# Patient Record
Sex: Male | Born: 1960
Health system: Southern US, Community
[De-identification: ages and names within clinical notes are randomized; demographics above are authoritative.]

## PROBLEM LIST (undated history)

## (undated) DIAGNOSIS — H409 Unspecified glaucoma: Secondary | ICD-10-CM

## (undated) DIAGNOSIS — E119 Type 2 diabetes mellitus without complications: Secondary | ICD-10-CM

## (undated) HISTORY — PX: REFRACTIVE SURGERY: SHX103

---

## 2009-05-08 ENCOUNTER — Emergency Department (HOSPITAL_BASED_OUTPATIENT_CLINIC_OR_DEPARTMENT_OTHER): Admission: EM | Admit: 2009-05-08 | Discharge: 2009-05-08 | Payer: Self-pay | Admitting: Emergency Medicine

## 2010-01-01 ENCOUNTER — Emergency Department (HOSPITAL_BASED_OUTPATIENT_CLINIC_OR_DEPARTMENT_OTHER): Admission: EM | Admit: 2010-01-01 | Discharge: 2010-01-01 | Payer: Self-pay | Admitting: Emergency Medicine

## 2010-01-01 ENCOUNTER — Ambulatory Visit: Payer: Self-pay | Admitting: Diagnostic Radiology

## 2010-04-07 ENCOUNTER — Emergency Department (HOSPITAL_BASED_OUTPATIENT_CLINIC_OR_DEPARTMENT_OTHER): Admission: EM | Admit: 2010-04-07 | Discharge: 2010-04-07 | Payer: Self-pay | Admitting: Emergency Medicine

## 2010-08-03 ENCOUNTER — Emergency Department (HOSPITAL_BASED_OUTPATIENT_CLINIC_OR_DEPARTMENT_OTHER)
Admission: EM | Admit: 2010-08-03 | Discharge: 2010-08-03 | Disposition: A | Discharge status: Home / Self Care | Payer: Self-pay | Attending: Emergency Medicine | Admitting: Emergency Medicine

## 2010-08-03 DIAGNOSIS — K5900 Constipation, unspecified: Secondary | ICD-10-CM | POA: Insufficient documentation

## 2011-04-14 ENCOUNTER — Encounter: Payer: Self-pay | Admitting: *Deleted

## 2011-04-14 ENCOUNTER — Emergency Department (HOSPITAL_BASED_OUTPATIENT_CLINIC_OR_DEPARTMENT_OTHER)
Admission: EM | Admit: 2011-04-14 | Discharge: 2011-04-15 | Discharge status: Against Medical Advice | Payer: Self-pay | Attending: Emergency Medicine | Admitting: Emergency Medicine

## 2011-04-14 DIAGNOSIS — K089 Disorder of teeth and supporting structures, unspecified: Secondary | ICD-10-CM | POA: Insufficient documentation

## 2011-04-14 DIAGNOSIS — K0889 Other specified disorders of teeth and supporting structures: Secondary | ICD-10-CM

## 2011-04-14 NOTE — ED Notes (Signed)
C/o left lower tooth pain and swelling since Sunday. Percocet 10mg  that he got from friend has not been helping the pain.

## 2011-04-15 NOTE — ED Notes (Signed)
Pt states he is not staying any longer cursing at staff about the wait time as he was walking out of the ed

## 2012-06-21 ENCOUNTER — Encounter (HOSPITAL_BASED_OUTPATIENT_CLINIC_OR_DEPARTMENT_OTHER): Payer: Self-pay

## 2012-06-21 ENCOUNTER — Emergency Department (HOSPITAL_BASED_OUTPATIENT_CLINIC_OR_DEPARTMENT_OTHER)
Admission: EM | Admit: 2012-06-21 | Discharge: 2012-06-21 | Disposition: A | Discharge status: Home / Self Care | Payer: Self-pay | Attending: Emergency Medicine | Admitting: Emergency Medicine

## 2012-06-21 DIAGNOSIS — J020 Streptococcal pharyngitis: Secondary | ICD-10-CM | POA: Insufficient documentation

## 2012-06-21 DIAGNOSIS — R509 Fever, unspecified: Secondary | ICD-10-CM | POA: Insufficient documentation

## 2012-06-21 DIAGNOSIS — Z79899 Other long term (current) drug therapy: Secondary | ICD-10-CM | POA: Insufficient documentation

## 2012-06-21 DIAGNOSIS — Z8669 Personal history of other diseases of the nervous system and sense organs: Secondary | ICD-10-CM | POA: Insufficient documentation

## 2012-06-21 DIAGNOSIS — R51 Headache: Secondary | ICD-10-CM | POA: Insufficient documentation

## 2012-06-21 HISTORY — DX: Unspecified glaucoma: H40.9

## 2012-06-21 MED ORDER — PENICILLIN V POTASSIUM 500 MG PO TABS: 500.0000 mg | ORAL_TABLET | Freq: Four times a day (QID) | ORAL | Status: AC

## 2012-06-21 MED ORDER — PENICILLIN G BENZATHINE 1200000 UNIT/2ML IM SUSP
1.2000 10*6.[IU] | Freq: Once | INTRAMUSCULAR | Status: AC
  Administered 2012-06-21: 1.2 10*6.[IU] via INTRAMUSCULAR
  Filled 2012-06-21: qty 2

## 2012-06-21 MED ORDER — IBUPROFEN 800 MG PO TABS: 800.0000 mg | ORAL_TABLET | Freq: Three times a day (TID) | ORAL | Status: AC

## 2012-06-21 NOTE — ED Notes (Signed)
Pt reports a sore throat and chills unrelieved after taking cough drops.

## 2012-06-21 NOTE — ED Provider Notes (Signed)
History     CSN: [id number]  Arrival date & time 06/21/12  1114   First MD Initiated Contact with Patient 06/21/12 1205      Chief Complaint  Patient presents with  . Sore Throat  . Chills    (Consider location/radiation/quality/duration/timing/severity/associated sxs/prior treatment) Patient is a 52 y.o. male presenting with pharyngitis. The history is provided by the patient. No language interpreter was used.  Sore Throat This is a new problem. The current episode started in the past 7 days. The problem occurs constantly. The problem has been gradually worsening. Associated symptoms include a fever, headaches and a sore throat. The symptoms are aggravated by swallowing. Treatments tried: cough drops. The treatment provided no relief.    Past Medical History  Diagnosis Date  . Glaucoma     Past Surgical History  Procedure Date  . Refractive surgery     No family history on file.  History  Substance Use Topics  . Smoking status: Never Smoker   . Smokeless tobacco: Not on file  . Alcohol Use: No      Review of Systems  Constitutional: Positive for fever.  HENT: Positive for sore throat.   Neurological: Positive for headaches.  All other systems reviewed and are negative.    Allergies  Onion  Home Medications   Current Outpatient Rx  Name  Route  Sig  Dispense  Refill  . DIPHENHYDRAMINE-APAP (SLEEP) 25-500 MG PO TABS   Oral   Take 1 tablet by mouth at bedtime as needed. For sleep          . IBUPROFEN 800 MG PO TABS   Oral   Take 1 tablet (800 mg total) by mouth 3 (three) times daily.   21 tablet   0   . PENICILLIN V POTASSIUM 500 MG PO TABS   Oral   Take 1 tablet (500 mg total) by mouth 4 (four) times daily.   40 tablet   0     BP 141/86  Pulse 91  Temp 99 F (37.2 C) (Oral)  Resp 16  Ht 5\' 7"  (1.702 m)  Wt 147 lb (66.679 kg)  BMI 23.02 kg/m2  SpO2 97%  Physical Exam  Nursing note and vitals reviewed. Constitutional: He appears  well-developed and well-nourished.  HENT:  Head: Normocephalic.  Mouth/Throat: Oropharyngeal exudate, posterior oropharyngeal edema and posterior oropharyngeal erythema present.  Eyes: Conjunctivae normal and EOM are normal. Pupils are equal, round, and reactive to light.  Neck: Normal range of motion. Neck supple.  Cardiovascular: Normal rate.   Pulmonary/Chest: Effort normal.  Abdominal: Soft.  Musculoskeletal: Normal range of motion.  Neurological: He is alert.  Skin: Skin is warm.    ED Course  Procedures (including critical care time)  Labs Reviewed  RAPID STREP SCREEN - Abnormal; Notable for the following:    Streptococcus, Group A Screen (Direct) POSITIVE (*)     All other components within normal limits   No results found.   1. Strep pharyngitis       MDM  Pt orginally requested rx for pcn,   Pt changed his mind and request IM treatment.   Bicillian LA and ibuprofen        Lonia Skinner [place], [place] 06/21/12 1247

## 2012-06-21 NOTE — ED Provider Notes (Signed)
Medical screening examination/treatment/procedure(s) were performed by non-physician practitioner and as supervising physician I was immediately available for consultation/collaboration.   Charles B. Bernette Mayers, MD 06/21/12 1252

## 2015-07-27 ENCOUNTER — Encounter (HOSPITAL_BASED_OUTPATIENT_CLINIC_OR_DEPARTMENT_OTHER): Payer: Self-pay | Admitting: Emergency Medicine

## 2015-07-27 ENCOUNTER — Emergency Department (HOSPITAL_BASED_OUTPATIENT_CLINIC_OR_DEPARTMENT_OTHER)
Admission: EM | Admit: 2015-07-27 | Discharge: 2015-07-27 | Disposition: A | Discharge status: Home / Self Care | Payer: Self-pay | Attending: Emergency Medicine | Admitting: Emergency Medicine

## 2015-07-27 DIAGNOSIS — M5442 Lumbago with sciatica, left side: Secondary | ICD-10-CM | POA: Insufficient documentation

## 2015-07-27 DIAGNOSIS — Z8669 Personal history of other diseases of the nervous system and sense organs: Secondary | ICD-10-CM | POA: Insufficient documentation

## 2015-07-27 DIAGNOSIS — R531 Weakness: Secondary | ICD-10-CM | POA: Insufficient documentation

## 2015-07-27 DIAGNOSIS — Z791 Long term (current) use of non-steroidal anti-inflammatories (NSAID): Secondary | ICD-10-CM | POA: Insufficient documentation

## 2015-07-27 DIAGNOSIS — R202 Paresthesia of skin: Secondary | ICD-10-CM | POA: Insufficient documentation

## 2015-07-27 DIAGNOSIS — F17200 Nicotine dependence, unspecified, uncomplicated: Secondary | ICD-10-CM | POA: Insufficient documentation

## 2015-07-27 MED ORDER — PREDNISONE 10 MG PO TABS: 20.0000 mg | ORAL_TABLET | Freq: Every day | ORAL | Status: DC

## 2015-07-27 NOTE — ED Notes (Signed)
Patient states that he is having pain to his lower back with some noted numbness and tingling to his legs. The patient reports that he started to have pain to his bilateral legs today

## 2015-07-27 NOTE — Discharge Instructions (Signed)
Medications: Aleve or Advil, Prednisone  Treatment: Take Aleve or Advil for pain. Most acute low back pain is self-limited and will resolve on its own with time. Take Prednisone for 5 days. You can massage your back with a tennis or lacrosse ball on your own. See a massage therapist or physical therapist. Alternate ice and heat, 20 min on, 20 min off, as discussed. Perform the back exercises daily as tolerated that are given in these discharge instructions below.  Follow-up: Please follow up with primary care provider or return to the emergency department if your symptoms are not improving in 2-3 weeks. In this case, you may need further evaluation. Please return to the emergency department immediately if you develop a fever, numbness in your groin, loss of bowel and/or bladder control, a change in mental status, or any other concerning symptom.   Sciatica Sciatica is pain, weakness, numbness, or tingling along the path of the sciatic nerve. The nerve starts in the lower back and runs down the back of each leg. The nerve controls the muscles in the lower leg and in the back of the knee, while also providing sensation to the back of the thigh, lower leg, and the sole of your foot. Sciatica is a symptom of another medical condition. For instance, nerve damage or certain conditions, such as a herniated disk or bone spur on the spine, pinch or put pressure on the sciatic nerve. This causes the pain, weakness, or other sensations normally associated with sciatica. Generally, sciatica only affects one side of the body. CAUSES   Herniated or slipped disc.  Degenerative disk disease.  A pain disorder involving the narrow muscle in the buttocks (piriformis syndrome).  Pelvic injury or fracture.  Pregnancy.  Tumor (rare). SYMPTOMS  Symptoms can vary from mild to very severe. The symptoms usually travel from the low back to the buttocks and down the back of the leg. Symptoms can include:  Mild tingling  or dull aches in the lower back, leg, or hip.  Numbness in the back of the calf or sole of the foot.  Burning sensations in the lower back, leg, or hip.  Sharp pains in the lower back, leg, or hip.  Leg weakness.  Severe back pain inhibiting movement. These symptoms may get worse with coughing, sneezing, laughing, or prolonged sitting or standing. Also, being overweight may worsen symptoms. DIAGNOSIS  Your caregiver will perform a physical exam to look for common symptoms of sciatica. He or she may ask you to do certain movements or activities that would trigger sciatic nerve pain. Other tests may be performed to find the cause of the sciatica. These may include:  Blood tests.  X-rays.  Imaging tests, such as an MRI or CT scan. TREATMENT  Treatment is directed at the cause of the sciatic pain. Sometimes, treatment is not necessary and the pain and discomfort goes away on its own. If treatment is needed, your caregiver may suggest:  Over-the-counter medicines to relieve pain.  Prescription medicines, such as anti-inflammatory medicine, muscle relaxants, or narcotics.  Applying heat or ice to the painful area.  Steroid injections to lessen pain, irritation, and inflammation around the nerve.  Reducing activity during periods of pain.  Exercising and stretching to strengthen your abdomen and improve flexibility of your spine. Your caregiver may suggest losing weight if the extra weight makes the back pain worse.  Physical therapy.  Surgery to eliminate what is pressing or pinching the nerve, such as a bone spur or  part of a herniated disk. HOME CARE INSTRUCTIONS   Only take over-the-counter or prescription medicines for pain or discomfort as directed by your caregiver.  Apply ice to the affected area for 20 minutes, 3-4 times a day for the first 48-72 hours. Then try heat in the same way.  Exercise, stretch, or perform your usual activities if these do not aggravate your  pain.  Attend physical therapy sessions as directed by your caregiver.  Keep all follow-up appointments as directed by your caregiver.  Do not wear high heels or shoes that do not provide proper support.  Check your mattress to see if it is too soft. A firm mattress may lessen your pain and discomfort. SEEK IMMEDIATE MEDICAL CARE IF:   You lose control of your bowel or bladder (incontinence).  You have increasing weakness in the lower back, pelvis, buttocks, or legs.  You have redness or swelling of your back.  You have a burning sensation when you urinate.  You have pain that gets worse when you lie down or awakens you at night.  Your pain is worse than you have experienced in the past.  Your pain is lasting longer than 4 weeks.  You are suddenly losing weight without reason. MAKE SURE YOU:  Understand these instructions.  Will watch your condition.  Will get help right away if you are not doing well or get worse.   This information is not intended to replace advice given to you by your health care provider. Make sure you discuss any questions you have with your health care provider.   Document Released: 04/27/2001 Document Revised: 01/22/2015 Document Reviewed: 09/12/2011 Elsevier Interactive Patient Education 2016 [employer]  Back Exercises The following exercises strengthen the muscles that help to support the back. They also help to keep the lower back flexible. Doing these exercises can help to prevent back pain or lessen existing pain. If you have back pain or discomfort, try doing these exercises 2-3 times each day or as told by your health care provider. When the pain goes away, do them once each day, but increase the number of times that you repeat the steps for each exercise (do more repetitions). If you do not have back pain or discomfort, do these exercises once each day or as told by your health care provider. EXERCISES Single Knee to Chest Repeat these  steps 3-5 times for each leg:  Lie on your back on a firm bed or the floor with your legs extended.  Bring one knee to your chest. Your other leg should stay extended and in contact with the floor.  Hold your knee in place by grabbing your knee or thigh.  Pull on your knee until you feel a gentle stretch in your lower back.  Hold the stretch for 10-30 seconds.  Slowly release and straighten your leg. Pelvic Tilt Repeat these steps 5-10 times:  Lie on your back on a firm bed or the floor with your legs extended.  Bend your knees so they are pointing toward the ceiling and your feet are flat on the floor.  Tighten your lower abdominal muscles to press your lower back against the floor. This motion will tilt your pelvis so your tailbone points up toward the ceiling instead of pointing to your feet or the floor.  With gentle tension and even breathing, hold this position for 5-10 seconds. Cat-Cow Repeat these steps until your lower back becomes more flexible:  Get into a hands-and-knees position on a  firm surface. Keep your hands under your shoulders, and keep your knees under your hips. You may place padding under your knees for comfort.  Let your head hang down, and point your tailbone toward the floor so your lower back becomes rounded like the back of a cat.  Hold this position for 5 seconds.  Slowly lift your head and point your tailbone up toward the ceiling so your back forms a sagging arch like the back of a cow.  Hold this position for 5 seconds. Press-Ups Repeat these steps 5-10 times:  Lie on your abdomen (face-down) on the floor.  Place your palms near your head, about shoulder-width apart.  While you keep your back as relaxed as possible and keep your hips on the floor, slowly straighten your arms to raise the top half of your body and lift your shoulders. Do not use your back muscles to raise your upper torso. You may adjust the placement of your hands to make  yourself more comfortable.  Hold this position for 5 seconds while you keep your back relaxed.  Slowly return to lying flat on the floor. Bridges Repeat these steps 10 times:  Lie on your back on a firm surface.  Bend your knees so they are pointing toward the ceiling and your feet are flat on the floor.  Tighten your buttocks muscles and lift your buttocks off of the floor until your waist is at almost the same height as your knees. You should feel the muscles working in your buttocks and the back of your thighs. If you do not feel these muscles, slide your feet 1-2 inches farther away from your buttocks.  Hold this position for 3-5 seconds.  Slowly lower your hips to the starting position, and allow your buttocks muscles to relax completely. If this exercise is too easy, try doing it with your arms crossed over your chest. Abdominal Crunches Repeat these steps 5-10 times:  Lie on your back on a firm bed or the floor with your legs extended.  Bend your knees so they are pointing toward the ceiling and your feet are flat on the floor.  Cross your arms over your chest.  Tip your chin slightly toward your chest without bending your neck.  Tighten your abdominal muscles and slowly raise your trunk (torso) high enough to lift your shoulder blades a tiny bit off of the floor. Avoid raising your torso higher than that, because it can put too much stress on your low back and it does not help to strengthen your abdominal muscles.  Slowly return to your starting position. Back Lifts Repeat these steps 5-10 times:  Lie on your abdomen (face-down) with your arms at your sides, and rest your forehead on the floor.  Tighten the muscles in your legs and your buttocks.  Slowly lift your chest off of the floor while you keep your hips pressed to the floor. Keep the back of your head in line with the curve in your back. Your eyes should be looking at the floor.  Hold this position for 3-5  seconds.  Slowly return to your starting position. SEEK MEDICAL CARE IF:  Your back pain or discomfort gets much worse when you do an exercise.  Your back pain or discomfort does not lessen within 2 hours after you exercise. If you have any of these problems, stop doing these exercises right away. Do not do them again unless your health care provider says that you can. SEEK IMMEDIATE MEDICAL  CARE IF:  You develop sudden, severe back pain. If this happens, stop doing the exercises right away. Do not do them again unless your health care provider says that you can.   This information is not intended to replace advice given to you by your health care provider. Make sure you discuss any questions you have with your health care provider.   Document Released: 06/10/2004 Document Revised: 01/22/2015 Document Reviewed: 06/27/2014 Elsevier Interactive Patient Education [employer] Inc[employer]

## 2015-07-27 NOTE — ED Provider Notes (Signed)
CSN: [id number]     Arrival date & time 07/27/15  1905 History   First MD Initiated Contact with Patient 07/27/15 2203     Chief Complaint  Patient presents with  . Back Pain     (Consider location/radiation/quality/duration/timing/severity/associated sxs/prior Treatment) HPI Comments: Raymond Scott is a 55 y.o. male who presents today with back pain. This is evaluated as a personal injury. The patient first noted symptoms 1week ago. It was not related to a known injury. The pain is rated moderate, and is located at the left lumbar area. The pain is described as sharp and occurs all day with intermittent leg pain and paresthesias radiating to the knee.  Symptoms are exacerbated by flexion and standing. Factors which relieve the pain include change in body position, lying down. Treatment efforts have included acetaminophen, without relief. Other associated symptoms include weakness in the left leg, tingling in the left leg and burning pain in the left leg. Previous history of back pain but without pain or paresthesias radiating to leg.  Patient denies fever, night sweats, weight loss, saddle anesthesia, bowel or bladder incontinence, chest pain, shortness of breath, abdominal pain, N/V/D, or headache.   The history is provided by the patient.    Past Medical History  Diagnosis Date  . Glaucoma    Past Surgical History  Procedure Laterality Date  . Refractive surgery     History reviewed. No pertinent family history. Social History  Substance Use Topics  . Smoking status: Current Every Day Smoker  . Smokeless tobacco: None  . Alcohol Use: No    Review of Systems  Constitutional: Negative for fever and chills.  HENT: Negative for facial swelling.   Eyes: Negative for discharge.  Respiratory: Negative for shortness of breath.   Cardiovascular: Negative for chest pain.  Gastrointestinal: Negative for nausea, vomiting and abdominal pain.  Genitourinary: Negative for dysuria.    Musculoskeletal: Positive for back pain.  Skin: Negative for rash and wound.  Neurological: Negative for headaches.  Psychiatric/Behavioral: The patient is not nervous/anxious.       Allergies  Onion  Home Medications   Prior to Admission medications   Medication Sig Start Date End Date Taking? Authorizing Provider  diphenhydramine-acetaminophen (TYLENOL PM) 25-500 MG TABS Take 1 tablet by mouth at bedtime as needed. For sleep     Historical Provider, MD  ibuprofen (ADVIL,MOTRIN) 800 MG tablet Take 1 tablet (800 mg total) by mouth 3 (three) times daily. 06/21/12   Elson Areas, PA-C  predniSONE (DELTASONE) 10 MG tablet Take 2 tablets (20 mg total) by mouth daily with breakfast. 07/27/15   Waylan Boga Chloeann Alfred, PA-C   BP 120/79 mmHg  Pulse 58  Temp(Src) 98.8 F (37.1 C) (Oral)  Resp 16  Ht  (1.702 m)  Wt 81.647 kg  BMI 28.19 kg/m2  SpO2 100% Physical Exam  Constitutional: He appears well-developed and well-nourished. No distress.  HENT:  Head: Normocephalic and atraumatic.  Eyes: Conjunctivae are normal. Pupils are equal, round, and reactive to light. Right eye exhibits no discharge. Left eye exhibits no discharge. No scleral icterus.  Neck: Normal range of motion. Neck supple. No thyromegaly present.  Cardiovascular: Normal rate, regular rhythm and normal heart sounds.  Exam reveals no gallop and no friction rub.   No murmur heard. Pulmonary/Chest: Effort normal and breath sounds normal. No stridor. No respiratory distress. He has no wheezes. He has no rales.  Abdominal: Soft. Bowel sounds are normal. He exhibits no distension. There is  no tenderness. There is no rebound, no guarding and no CVA tenderness.  Musculoskeletal: He exhibits no edema.       Lumbar back: He exhibits decreased range of motion, tenderness, pain and spasm. He exhibits no bony tenderness.       Back:       Legs: Pain on flexion, decreased flexion; no pain on extension or lateral flexion; patient can  heel and toe raise; SLR pos at 20 degrees L leg  Lymphadenopathy:    He has no cervical adenopathy.  Neurological: He is alert. He has normal strength. He is not disoriented. No cranial nerve deficit or sensory deficit. He displays a negative Romberg sign. Coordination and gait normal. GCS eye subscore is 4. GCS verbal subscore is 5. GCS motor subscore is 6.  Positive SLR at 20 degrees L leg  Skin: Skin is warm and dry. No rash noted. He is not diaphoretic. No pallor.  Psychiatric: He has a normal mood and affect.  Nursing note and vitals reviewed.   ED Course  Procedures (including critical care time) Labs Review Labs Reviewed - No data to display  Imaging Review No results found. I have personally reviewed and evaluated these images and lab results as part of my medical decision-making.   EKG Interpretation None      MDM   Patient with back pain.  No focal neurological deficits. Normal neuro exam, besides some paresthesia on L posterior thigh where patient has been experiencing pain and paresthesias.  Patient is ambulatory, normal gait.  No loss of bowel or bladder control.  No concern for cauda equina.  No fever, night sweats, weight loss, h/o cancer, IVDA, no recent procedure to back. No urinary symptoms suggestive of UTI.  Patient advised to take Advil or Aleve at home for pain. Patient given 5 day burst therapy prednisone. Supportive care and return precaution discussed. Patient advised to do back exercises and see a massage or physical therapist. Appears safe for discharge at this time. Follow up as indicated in discharge paperwork.    Final diagnoses:  Left-sided low back pain with left-sided sciatica      Emi Holeslexandra M Persais Ethridge, PA-C 07/28/15 0105  Paula LibraJohn Molpus, MD 07/28/15 [phone]

## 2015-07-27 NOTE — ED Notes (Signed)
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 Has tried Tylenol, no meds PTA.

## 2015-12-14 ENCOUNTER — Emergency Department (HOSPITAL_BASED_OUTPATIENT_CLINIC_OR_DEPARTMENT_OTHER)
Admission: EM | Admit: 2015-12-14 | Discharge: 2015-12-14 | Disposition: A | Discharge status: Home / Self Care | Payer: Medicaid Other | Attending: Emergency Medicine | Admitting: Emergency Medicine

## 2015-12-14 ENCOUNTER — Encounter (HOSPITAL_BASED_OUTPATIENT_CLINIC_OR_DEPARTMENT_OTHER): Payer: Self-pay | Admitting: *Deleted

## 2015-12-14 DIAGNOSIS — M5442 Lumbago with sciatica, left side: Secondary | ICD-10-CM | POA: Diagnosis not present

## 2015-12-14 DIAGNOSIS — F17200 Nicotine dependence, unspecified, uncomplicated: Secondary | ICD-10-CM | POA: Insufficient documentation

## 2015-12-14 DIAGNOSIS — Z79899 Other long term (current) drug therapy: Secondary | ICD-10-CM | POA: Insufficient documentation

## 2015-12-14 DIAGNOSIS — M79605 Pain in left leg: Secondary | ICD-10-CM | POA: Diagnosis present

## 2015-12-14 DIAGNOSIS — M5432 Sciatica, left side: Secondary | ICD-10-CM

## 2015-12-14 MED ORDER — KETOROLAC TROMETHAMINE 30 MG/ML IJ SOLN
30.0000 mg | Freq: Once | INTRAMUSCULAR | Status: AC
  Administered 2015-12-14: 30 mg via INTRAMUSCULAR
  Filled 2015-12-14: qty 1

## 2015-12-14 MED ORDER — HYDROCODONE-ACETAMINOPHEN 5-325 MG PO TABS: 1.0000 | ORAL_TABLET | ORAL | 0 refills | Status: AC | PRN

## 2015-12-14 MED ORDER — DEXAMETHASONE SODIUM PHOSPHATE 10 MG/ML IJ SOLN
10.0000 mg | Freq: Once | INTRAMUSCULAR | Status: AC
  Administered 2015-12-14: 10 mg via INTRAMUSCULAR
  Filled 2015-12-14: qty 1

## 2015-12-14 MED ORDER — PREDNISONE 10 MG (21) PO TBPK: 10.0000 mg | ORAL_TABLET | Freq: Every day | ORAL | 0 refills | Status: DC

## 2015-12-14 NOTE — ED Notes (Signed)
Pt seen by EDP prior to RN assessment, see MD notes, orders received to medicate and d/c.  

## 2015-12-14 NOTE — ED Provider Notes (Signed)
[facility]-EMERGENCY DEPT Provider Note   CSN: [id number] Arrival date & time: 12/14/15  2115  First Provider Contact:  First MD Initiated Contact with Patient 12/14/15 2200   By signing my name below, I, Bridgette Habermann, attest that this documentation has been prepared under the direction and in the presence of Jacalyn Lefevre, MD. Electronically Signed: Bridgette Habermann, ED Scribe. 12/14/15. 10:06 PM.   History   Chief Complaint Chief Complaint  Patient presents with  . Leg Pain    HPI Comments: Raymond Scott is a 55 y.o. male who presents to the Emergency Department complaining of sudden onset, constant bilateral leg pain onset two weeks ago. Pt notes the left leg pain is more severe than right. Pt also has associated paresthesia in the back of his legs. He states pain is exacerbated with standing and walking. Pt was seen for these same symptoms a month ago and prescribed muscle relaxants and advised to follow up with an orthopedist. Pt notes that they thought it was a back issue but states his back is not hurting. Pt went to physical therapy once and states he thinks it made the pain worse. Pt denies any injury. Negative bladder incontinence and bowel incontinence.  The history is provided by the patient. No language interpreter was used.    Past Medical History:  Diagnosis Date  . Glaucoma     There are no active problems to display for this patient.   Past Surgical History:  Procedure Laterality Date  . REFRACTIVE SURGERY       Home Medications    Prior to Admission medications   Medication Sig Start Date End Date Taking? Authorizing Provider  MELOXICAM PO Take by mouth.   Yes Historical Provider, MD  diphenhydramine-acetaminophen (TYLENOL PM) 25-500 MG TABS Take 1 tablet by mouth at bedtime as needed. For sleep     Historical Provider, MD  HYDROcodone-acetaminophen (NORCO/VICODIN) 5-325 MG tablet Take 1 tablet by mouth every 4 (four) hours as needed. 12/14/15   Jacalyn Lefevre, MD    ibuprofen (ADVIL,MOTRIN) 800 MG tablet Take 1 tablet (800 mg total) by mouth 3 (three) times daily. 06/21/12   Elson Areas, PA-C  predniSONE (STERAPRED UNI-PAK 21 TAB) 10 MG (21) TBPK tablet Take 1 tablet (10 mg total) by mouth daily. Take 6 tabs by mouth daily  for 2 days, then 5 tabs for 2 days, then 4 tabs for 2 days, then 3 tabs for 2 days, 2 tabs for 2 days, then 1 tab by mouth daily for 2 days 12/14/15   Jacalyn Lefevre, MD    Family History No family history on file.  Social History Social History  Substance Use Topics  . Smoking status: Current Every Day Smoker  . Smokeless tobacco: Never Used  . Alcohol use No     Allergies   Onion   Review of Systems Review of Systems 10 Systems reviewed and all are negative for acute change except as noted in the HPI. Physical Exam Updated Vital Signs BP 127/78 (BP Location: Right Arm)   Pulse 81   Temp 97.8 F (36.6 C) (Oral)   Resp 16   Ht 5\' 7"  (1.702 m)   Wt 175 lb (79.4 kg)   SpO2 94%   BMI 27.41 kg/m   Physical Exam  Constitutional: He appears well-developed and well-nourished.  HENT:  Head: Normocephalic.  Eyes: Conjunctivae are normal.  Cardiovascular: Normal rate.   Pulmonary/Chest: Effort normal. No respiratory distress.  Abdominal: He exhibits no distension.  Musculoskeletal: Normal range of motion. He exhibits tenderness.  Lumbar tenderness on the left. Positive straight leg raise on the left.   Neurological: He is alert.  Skin: Skin is warm and dry.  Psychiatric: He has a normal mood and affect. His behavior is normal.  Nursing note and vitals reviewed.   ED Treatments / Results  DIAGNOSTIC STUDIES: Oxygen Saturation is 97% on RA, adequate by my interpretation.    COORDINATION OF CARE: 10:02 PM Discussed treatment plan with pt at bedside and pt agreed to plan.  Labs (all labs ordered are listed, but only abnormal results are displayed) Labs Reviewed - No data to display  EKG  EKG  Interpretation None       Radiology No results found.  Procedures Procedures (including critical care time)  Medications Ordered in ED Medications  dexamethasone (DECADRON) injection 10 mg (10 mg Intramuscular Given 12/14/15 2227)  ketorolac (TORADOL) 30 MG/ML injection 30 mg (30 mg Intramuscular Given 12/14/15 2227)     Initial Impression / Assessment and Plan / ED Course  I have reviewed the triage vital signs and the nursing notes.  Pertinent labs & imaging results that were available during my care of the patient were reviewed by me and considered in my medical decision making (see chart for details).  Clinical Course   I personally performed the services described in this documentation, which was scribed in my presence. The recorded information has been reviewed and is accurate.   Final Clinical Impressions(s) / ED Diagnoses   Final diagnoses:  Sciatica of left side    New Prescriptions Discharge Medication List as of 12/14/2015 10:05 PM    START taking these medications   Details  HYDROcodone-acetaminophen (NORCO/VICODIN) 5-325 MG tablet Take 1 tablet by mouth every 4 (four) hours as needed., Starting Sun 12/14/2015, Print    predniSONE (STERAPRED UNI-PAK 21 TAB) 10 MG (21) TBPK tablet Take 1 tablet (10 mg total) by mouth daily. Take 6 tabs by mouth daily  for 2 days, then 5 tabs for 2 days, then 4 tabs for 2 days, then 3 tabs for 2 days, 2 tabs for 2 days, then 1 tab by mouth daily for 2 days, Starting Sun 12/14/2015, Print      *   Jacalyn Lefevre, MD 12/22/15 [phone]

## 2015-12-14 NOTE — ED Triage Notes (Signed)
Pt c/o bilateral leg pain that started 2 weeks ago. Left leg pain is greater than right. States he has been seen for this same pain one month ago. States they recommended PT. States they were thinking it was a back issue but pt states his back is not hurting. Denies any injury. States pain worse with standing and walking.

## 2016-11-12 ENCOUNTER — Emergency Department (HOSPITAL_BASED_OUTPATIENT_CLINIC_OR_DEPARTMENT_OTHER)
Admission: EM | Admit: 2016-11-12 | Discharge: 2016-11-13 | Disposition: A | Discharge status: Home / Self Care | Payer: Medicaid Other | Attending: Emergency Medicine | Admitting: Emergency Medicine

## 2016-11-12 ENCOUNTER — Encounter (HOSPITAL_BASED_OUTPATIENT_CLINIC_OR_DEPARTMENT_OTHER): Payer: Self-pay | Admitting: *Deleted

## 2016-11-12 ENCOUNTER — Emergency Department (HOSPITAL_BASED_OUTPATIENT_CLINIC_OR_DEPARTMENT_OTHER): Payer: Medicaid Other

## 2016-11-12 DIAGNOSIS — Y999 Unspecified external cause status: Secondary | ICD-10-CM | POA: Diagnosis not present

## 2016-11-12 DIAGNOSIS — Y929 Unspecified place or not applicable: Secondary | ICD-10-CM | POA: Diagnosis not present

## 2016-11-12 DIAGNOSIS — L02512 Cutaneous abscess of left hand: Secondary | ICD-10-CM

## 2016-11-12 DIAGNOSIS — L02511 Cutaneous abscess of right hand: Secondary | ICD-10-CM | POA: Diagnosis not present

## 2016-11-12 DIAGNOSIS — Y939 Activity, unspecified: Secondary | ICD-10-CM | POA: Diagnosis not present

## 2016-11-12 DIAGNOSIS — S60942A Unspecified superficial injury of right middle finger, initial encounter: Secondary | ICD-10-CM | POA: Diagnosis present

## 2016-11-12 DIAGNOSIS — Z79899 Other long term (current) drug therapy: Secondary | ICD-10-CM | POA: Diagnosis not present

## 2016-11-12 DIAGNOSIS — X58XXXA Exposure to other specified factors, initial encounter: Secondary | ICD-10-CM | POA: Insufficient documentation

## 2016-11-12 DIAGNOSIS — F17200 Nicotine dependence, unspecified, uncomplicated: Secondary | ICD-10-CM | POA: Insufficient documentation

## 2016-11-12 MED ORDER — SULFAMETHOXAZOLE-TRIMETHOPRIM 800-160 MG PO TABS: 1.0000 | ORAL_TABLET | Freq: Two times a day (BID) | ORAL | 0 refills | Status: AC

## 2016-11-12 MED ORDER — LIDOCAINE HCL (PF) 1 % IJ SOLN
5.0000 mL | Freq: Once | INTRAMUSCULAR | Status: AC
  Administered 2016-11-13: 5 mL
  Filled 2016-11-12: qty 5

## 2016-11-12 MED ORDER — CEPHALEXIN 500 MG PO CAPS: 500.0000 mg | ORAL_CAPSULE | Freq: Four times a day (QID) | ORAL | 0 refills | Status: AC

## 2016-11-12 NOTE — ED Triage Notes (Signed)
He hit his right middle finger last week while working on a car. Now it is swollen and has drainage at times.

## 2016-11-12 NOTE — ED Notes (Signed)
Car part fell on middle finger rt han w a minor abrasion at that time  No knuckle is swollen w some drainage at times

## 2016-11-12 NOTE — ED Provider Notes (Signed)
[facility] Provider [facility]   CSN: [id number] Arrival date & time: 11/12/16  2219  By signing my name below, I, Rosana Fret, attest that this documentation has been prepared under the direction and in the presence of non-physician practitioner, Russo, [place], PA-C. Electronically Signed: Rosana Fret, ED Scribe. 11/12/16. 11:17 PM.  History   Chief Complaint Chief Complaint  Patient presents with  . Finger Injury   The history is provided by the patient. No language interpreter was used.   HPI Comments: Raymond Scott is a 56 y.o. male who presents to the Emergency Department complaining of gradual onset, moderate pain to his right middle finger onset 4 days ago. Pt states he cut his finger 1 week ago at work and thought it had healed until he noticed some swelling and drainage. Pt describes pain as intermittently sharp. Tetanus is UTD. Pt denies dec ROM, fever, nausea, vomiting, numbness/tingling or any other complaints at this time.   Past Medical History:  Diagnosis Date  . Glaucoma     There are no active problems to display for this patient.   Past Surgical History:  Procedure Laterality Date  . REFRACTIVE SURGERY         Home Medications    Prior to Admission medications   Medication Sig Start Date End Date Taking? Authorizing Provider  GABAPENTIN PO Take by mouth.   Yes [provider]  ibuprofen (ADVIL,MOTRIN) 800 MG tablet Take 1 tablet (800 mg total) by mouth 3 (three) times daily. 06/21/12  Yes Cheron Schaumann K, PA-C  cephALEXin (KEFLEX) 500 MG capsule Take 1 capsule (500 mg total) by mouth 4 (four) times daily. 11/12/16 11/19/16  Russo, [place] N, PA-C  diphenhydramine-acetaminophen (TYLENOL PM) 25-500 MG TABS Take 1 tablet by mouth at bedtime as needed. For sleep     [provider]  HYDROcodone-acetaminophen (NORCO/VICODIN) 5-325 MG tablet Take 1 tablet by mouth every 4 (four) hours as needed. 12/14/15   Jacalyn Lefevre, MD  MELOXICAM  PO Take by mouth.    [provider]  predniSONE (STERAPRED UNI-PAK 21 TAB) 10 MG (21) TBPK tablet Take 1 tablet (10 mg total) by mouth daily. Take 6 tabs by mouth daily  for 2 days, then 5 tabs for 2 days, then 4 tabs for 2 days, then 3 tabs for 2 days, 2 tabs for 2 days, then 1 tab by mouth daily for 2 days 12/14/15   Jacalyn Lefevre, MD  sulfamethoxazole-trimethoprim (BACTRIM DS,SEPTRA DS) 800-160 MG tablet Take 1 tablet by mouth 2 (two) times daily. 11/12/16 11/19/16  Russo, [place] N, PA-C    Family History No family history on file.  Social History Social History  Substance Use Topics  . Smoking status: Current Every Day Smoker  . Smokeless tobacco: Never Used  . Alcohol use No     Allergies   Onion   Review of Systems Review of Systems  Constitutional: Negative for chills and fever.  Gastrointestinal: Negative for nausea and vomiting.  Musculoskeletal: Positive for arthralgias and joint swelling. Negative for myalgias.  Skin: Positive for wound.  Neurological: Negative for numbness.     Physical Exam Updated Vital Signs BP 133/88 (BP Location: Left Arm)   Pulse 64   Temp 98.1 F (36.7 C) (Oral)   Resp 16   Wt 80.6 kg (177 lb 11.1 oz)   SpO2 96%   BMI 27.83 kg/m   Physical Exam  Constitutional: He is oriented to person, place, and time. He appears well-developed and  well-nourished. No distress.  HENT:  Head: Normocephalic and atraumatic.  Eyes: Conjunctivae are normal.  Cardiovascular: Normal rate and intact distal pulses.   Intact radial and ulnar pulses.   Pulmonary/Chest: Effort normal.  Musculoskeletal: Normal range of motion. He exhibits edema.  Right 3rd finger has edema and mild erythema of the middle interphalangeal joint. Scab on the dorsal aspect of this joint. Normal ROM of finger. Nontender.  Neurological: He is alert and oriented to person, place, and time.  Skin: Skin is warm and dry. Capillary refill takes less than 2 seconds. There is  erythema.  Psychiatric: He has a normal mood and affect. His behavior is normal.  Nursing note and vitals reviewed.    ED Treatments / Results  DIAGNOSTIC STUDIES: Oxygen Saturation is 96% on RA, normal by my interpretation.   COORDINATION OF CARE: 11:13 PM-Discussed next steps with pt including I&D and treatment w abx. Pt verbalized understanding and is agreeable with the plan.   Labs (all labs ordered are listed, but only abnormal results are displayed) Labs Reviewed - No data to display  EKG  EKG Interpretation None       Radiology Dg Hand Complete Right  Result Date: 11/12/2016 CLINICAL DATA:  Pain and swelling 5 days after blunt trauma to the right hand. EXAM: RIGHT HAND - COMPLETE 3+ VIEW COMPARISON:  None. FINDINGS: There is no evidence of fracture or dislocation. There is no evidence of arthropathy or other focal bone abnormality. Soft tissues are unremarkable. IMPRESSION: Negative. Electronically Signed   By: Ellery Plunkaniel R Mitchell M.D.   On: 11/12/2016 22:40    Procedures .Marland Kitchen.Incision and Drainage Date/Time: 11/13/2016 12:02 AM Performed by: RUSSO, [place]ORDAN N Authorized by: RUSSO, [place]ORD[place] N   Consent:    Consent obtained:  Verbal   Consent given by:  Patient   Risks discussed:  Bleeding, incomplete drainage, pain and infection   Alternatives discussed:  No treatment and delayed treatment Location:    Type:  Abscess   Size:  1cm   Location:  Upper extremity   Upper extremity location:  Hand   Hand location:  R hand (right middle finger) Pre-procedure details:    Skin preparation:  Betadine Anesthesia (see MAR for exact dosages):    Anesthesia method:  Nerve block   Block location:  Base of right 3rd digit   Block needle gauge:  27 G   Block anesthetic:  Lidocaine 1% w/o epi   Block injection procedure:  Introduced needle and anatomic landmarks palpated   Block outcome:  Anesthesia achieved Procedure type:    Complexity:  Simple Procedure details:    Incision  type: de-roofed scab.   Incision depth:  Dermal   Scalpel blade:  11   Wound management:  Irrigated with saline   Drainage:  Bloody and purulent   Drainage amount:  Scant   Wound treatment:  Wound left open   Packing materials:  None Post-procedure details:    Patient tolerance of procedure:  Tolerated well, no immediate complications     (including critical care time)  Medications Ordered in ED Medications  lidocaine (PF) (XYLOCAINE) 1 % injection 5 mL (5 mLs Infiltration Given by Other 11/13/16 0013)  sulfamethoxazole-trimethoprim (BACTRIM DS,SEPTRA DS) 800-160 MG per tablet 1 tablet (1 tablet Oral Given 11/13/16 0013)  cephALEXin (KEFLEX) capsule 500 mg (500 mg Oral Given 11/13/16 0013)     Initial Impression / Assessment and Plan / ED Course  I have reviewed the triage vital signs and the nursing  notes.  Pertinent labs & imaging results that were available during my care of the patient were reviewed by me and considered in my medical decision making (see chart for details).     Pt w abscess to finger. Patient X-Ray negative for obvious fracture or dislocation or arthropathy. Pt with full ROM of joint without pain. Doubt septic arthritis at this time. Abscess de-roofed and left open. Pt advised to follow up on Sunday for wound recheck and instructions for follow up with PCP vs hand specialist, as indicated. Pt given Rx for Keflex and Bactrim. Pt is afebrile, not in distress, safe for discharge. Returns precautions discussed.   Patient discussed with and seen by Dr. Erma Heritage.  Discussed results, findings, treatment and follow up. Patient advised of return precautions. Patient verbalized understanding and agreed with plan.   Final Clinical Impressions(s) / ED Diagnoses   Final diagnoses:  Abscess of finger, left    New Prescriptions Discharge Medication List as of 11/13/2016 12:10 AM    START taking these medications   Details  cephALEXin (KEFLEX) 500 MG capsule Take 1  capsule (500 mg total) by mouth 4 (four) times daily., Starting Fri 11/12/2016, Until Fri 11/19/2016, Print    sulfamethoxazole-trimethoprim (BACTRIM DS,SEPTRA DS) 800-160 MG tablet Take 1 tablet by mouth 2 (two) times daily., Starting Fri 11/12/2016, Until Fri 11/19/2016, Print       I personally performed the services described in this documentation, which was scribed in my presence. The recorded information has been reviewed and is accurate.\    Russo, [place] N, PA-C 11/13/16 Katherina Mires    Shaune Pollack, MD 11/13/16 [phone]

## 2016-11-13 MED ORDER — SULFAMETHOXAZOLE-TRIMETHOPRIM 800-160 MG PO TABS
1.0000 | ORAL_TABLET | Freq: Once | ORAL | Status: AC
  Administered 2016-11-13: 1 via ORAL
  Filled 2016-11-13: qty 1

## 2016-11-13 MED ORDER — CEPHALEXIN 250 MG PO CAPS
500.0000 mg | ORAL_CAPSULE | Freq: Once | ORAL | Status: AC
  Administered 2016-11-13: 500 mg via ORAL
  Filled 2016-11-13: qty 2

## 2016-11-13 NOTE — Discharge Instructions (Signed)
Please read instructions below.  Soak your finger in warm soapy water, a few times per day.  Take the antibiotics as prescribed until they are gone. Return here on Sunday for wound recheck and for follow up plan after that. You can take advil as needed for pain. You can apply ice if you needed added pain relief. Return to the ER for fever, worsening redness, or new or worsening symptoms.

## 2017-06-27 ENCOUNTER — Other Ambulatory Visit: Payer: Self-pay

## 2017-06-27 ENCOUNTER — Encounter (HOSPITAL_BASED_OUTPATIENT_CLINIC_OR_DEPARTMENT_OTHER): Payer: Self-pay | Admitting: *Deleted

## 2017-06-27 ENCOUNTER — Emergency Department (HOSPITAL_BASED_OUTPATIENT_CLINIC_OR_DEPARTMENT_OTHER)
Admission: EM | Admit: 2017-06-27 | Discharge: 2017-06-27 | Disposition: A | Discharge status: Home / Self Care | Payer: Medicaid Other | Attending: Emergency Medicine | Admitting: Emergency Medicine

## 2017-06-27 DIAGNOSIS — G8929 Other chronic pain: Secondary | ICD-10-CM | POA: Diagnosis not present

## 2017-06-27 DIAGNOSIS — M545 Low back pain, unspecified: Secondary | ICD-10-CM

## 2017-06-27 DIAGNOSIS — Z79899 Other long term (current) drug therapy: Secondary | ICD-10-CM | POA: Insufficient documentation

## 2017-06-27 DIAGNOSIS — F17200 Nicotine dependence, unspecified, uncomplicated: Secondary | ICD-10-CM | POA: Diagnosis not present

## 2017-06-27 LAB — URINALYSIS, MICROSCOPIC (REFLEX)

## 2017-06-27 LAB — URINALYSIS, ROUTINE W REFLEX MICROSCOPIC
Bilirubin Urine: NEGATIVE
Glucose, UA: >=500 mg/dL — AB
Hgb urine dipstick: NEGATIVE
Ketones, ur: 15 mg/dL — AB
Leukocytes, UA: NEGATIVE
NITRITE: NEGATIVE
PROTEIN: NEGATIVE mg/dL
SPECIFIC GRAVITY, URINE: 1.02 (ref 1.005–1.030)
pH: 6.5 (ref 5.0–8.0)

## 2017-06-27 MED ORDER — KETOROLAC TROMETHAMINE 10 MG PO TABS: 10.0000 mg | ORAL_TABLET | Freq: Four times a day (QID) | ORAL | 0 refills | Status: AC | PRN

## 2017-06-27 MED ORDER — CYCLOBENZAPRINE HCL 10 MG PO TABS: 10.0000 mg | ORAL_TABLET | Freq: Two times a day (BID) | ORAL | 0 refills | Status: AC | PRN

## 2017-06-27 MED ORDER — ACETAMINOPHEN 500 MG PO TABS: 1000.0000 mg | ORAL_TABLET | Freq: Once | ORAL | Status: DC

## 2017-06-27 MED ORDER — OXYCODONE-ACETAMINOPHEN 5-325 MG PO TABS
1.0000 | ORAL_TABLET | Freq: Once | ORAL | Status: AC
  Administered 2017-06-27: 1 via ORAL
  Filled 2017-06-27: qty 1

## 2017-06-27 MED FILL — CYCLOBENZAPRINE HCL 10 MG T: 10 | 10 days supply | Qty: 20 | Fill #0

## 2017-06-27 MED FILL — KETOROLAC 10 MG TABLET: 10 | 5 days supply | Qty: 20 | Fill #0

## 2017-06-27 NOTE — ED Provider Notes (Signed)
[facility] EMERGENCY DEPARTMENT Provider Note   CSN: [id number] Arrival date & time: 06/27/17  1316     History   Chief Complaint Chief Complaint  Patient presents with  . Back Pain    HPI Raymond Scott is a 57 y.o. male with history of chronic back pain here for evaluation of low back pain consistent with previous, known back pain that radiates down left anterior leg and posterior right leg. He denies new injuries, falls, fevers, chills, abdominal pain, chest pain, shortness of breath, urinary symptoms, saddle anesthesia, bladder/bowel incontinence or retention, unilateral numbness or weakness to extremities. He ran out of his Vicodin and tramadol yesterday. Has been taking gabapentin at states alone it does not help his pain. He has a scheduled surgery on February 21. No history of kidney stones or IV drug use.  HPI  Past Medical History:  Diagnosis Date  . Glaucoma     There are no active problems to display for this patient.   Past Surgical History:  Procedure Laterality Date  . REFRACTIVE SURGERY         Home Medications    Prior to Admission medications   Medication Sig Start Date End Date Taking? Authorizing Provider  cyclobenzaprine (FLEXERIL) 10 MG tablet Take 1 tablet (10 mg total) by mouth 2 (two) times daily as needed for muscle spasms. 06/27/17   Liberty Handy, PA-C  diphenhydramine-acetaminophen (TYLENOL PM) 25-500 MG TABS Take 1 tablet by mouth at bedtime as needed. For sleep     [provider]  GABAPENTIN PO Take by mouth.    [provider]  HYDROcodone-acetaminophen (NORCO/VICODIN) 5-325 MG tablet Take 1 tablet by mouth every 4 (four) hours as needed. 12/14/15   Jacalyn Lefevre, MD  ibuprofen (ADVIL,MOTRIN) 800 MG tablet Take 1 tablet (800 mg total) by mouth 3 (three) times daily. 06/21/12   Elson Areas, PA-C  ketorolac (TORADOL) 10 MG tablet Take 1 tablet (10 mg total) by mouth every 6 (six) hours as needed. 06/27/17    Liberty Handy, PA-C  MELOXICAM PO Take by mouth.    [provider]    Family History History reviewed. No pertinent family history.  Social History Social History   Tobacco Use  . Smoking status: Current Every Day Smoker  . Smokeless tobacco: Never Used  Substance Use Topics  . Alcohol use: No  . Drug use: No     Allergies   Onion   Review of Systems Review of Systems  Musculoskeletal: Positive for back pain.  All other systems reviewed and are negative.    Physical Exam Updated Vital Signs BP 128/89 (BP Location: Right Arm)   Pulse 60   Temp 98.3 F (36.8 C)   Resp 18   Ht 5\' 4"  (1.626 m)   Wt 80.3 kg (177 lb)   SpO2 100%   BMI 30.38 kg/m   Physical Exam  Constitutional: He appears well-developed and well-nourished. No distress.  HENT:  Head: Normocephalic and atraumatic.  Nose: Nose normal.  Eyes: EOM are normal.  Neck:  No midline cervical spine tenderness No cervical paraspinal muscular tenderness or increased tone Full AROM of cervical spine without pain or rigidity   Cardiovascular: Normal rate, S1 normal, S2 normal and normal heart sounds.  Pulses:      Radial pulses are 2+ on the right side, and 2+ on the left side.       Dorsalis pedis pulses are 2+ on the right side, and  2+ on the left side.  Pulmonary/Chest: Effort normal and breath sounds normal. He has no decreased breath sounds. He exhibits no tenderness.  Abdominal: Soft. Normal appearance and bowel sounds are normal. There is no tenderness.  No suprapubic or CVA tenderness   Musculoskeletal: He exhibits tenderness.       Lumbar back: He exhibits tenderness and pain.  No midline CTL spine tenderness Bilateral SI joint tenderness and paraspinal lumbar muscular tenderness.  Able to sit up, stand up without obvious disability or pain   Full AROM of T/L spine without reported pain Full PROM hip bilaterally without reported pain Negative SLR. Negative Faber. Negative  Stinchfield test.   Neurological:  5/5 strength with flexion/extension of hip, knee and ankle, bilaterally.  Sensation to light touch intact in lower extremities including feet  Skin: Skin is warm and dry. Capillary refill takes less than 2 seconds.  Psychiatric: He has a normal mood and affect. His behavior is normal. Judgment and thought content normal.     ED Treatments / Results  Labs (all labs ordered are listed, but only abnormal results are displayed) Labs Reviewed  URINALYSIS, ROUTINE W REFLEX MICROSCOPIC - Abnormal; Notable for the following components:      Result Value   Glucose, UA >=500 (*)    Ketones, ur 15 (*)    All other components within normal limits  URINALYSIS, MICROSCOPIC (REFLEX) - Abnormal; Notable for the following components:   Bacteria, UA RARE (*)    Squamous Epithelial / LPF 0-5 (*)    All other components within normal limits    EKG  EKG Interpretation None       Radiology No results found.  Procedures Procedures (including critical care time)  Medications Ordered in ED Medications  acetaminophen (TYLENOL) tablet 1,000 mg (1,000 mg Oral Refused 06/27/17 1510)  oxyCODONE-acetaminophen (PERCOCET/ROXICET) 5-325 MG per tablet 1 tablet (1 tablet Oral Given 06/27/17 1510)     Initial Impression / Assessment and Plan / ED Course  I have reviewed the triage vital signs and the nursing notes.  Pertinent labs & imaging results that were available during my care of the patient were reviewed by me and considered in my medical decision making (see chart for details).     Patient is a 57 y.o. male with a hx chronic back pain here for back pain consistent with known, typical back pain.  Ran out of his vicodin and tramadol recently. [place] narcotic database reviewed, pt given rx for tramadol recently with end date yesterday consistent with his reported story.  On exam pt has VSS, abdominal exam reassuring without suprapubic or CVAT. Distal pulses  symmetric bilaterally.  Musculoskeletal exam revealed reproducible bilateral SI joint tenderness.  No focal neurological deficits appreciated. Patient is ambulatory. No red flag symptoms of back pain including: bladder/bowel incontinence or retention, night sweats, night pain, fevers or weight loss, h/o cancer, IVDU, recent trauma or falls.  Doubt kidney stone, cauda equina, epidural abscess, AAA, dissection. Labs and imaging not indicated today as physical exam reassuring. Suspect acute break through of chronic pain due to running out of pain meds.  Conservative measures such as ice/heat, mild stretches, muscle relaxant and high dose NSAIDs indicated with PCP follow-up if no improvement with conservative management. ED return precautions discussed with patient who verbalized understanding and is agreeable to plan.   Final Clinical Impressions(s) / ED Diagnoses   Final diagnoses:  Chronic bilateral low back pain without sciatica    ED Discharge Orders  Ordered    cyclobenzaprine (FLEXERIL) 10 MG tablet  2 times daily PRN     06/27/17 1456    ketorolac (TORADOL) 10 MG tablet  Every 6 hours PRN     06/27/17 1456       Jerrell MylarGibbons, Peterson Mathey J, PA-C 06/27/17 1630    Rolland PorterJames, Mark, MD 06/28/17 [phone]

## 2017-06-27 NOTE — ED Notes (Signed)
pt scheduled to have surgery on the 21st of this month for his back.  Pt is on gabapentin and hydrocodone for pain but he states it doesn't help.

## 2017-06-27 NOTE — ED Triage Notes (Addendum)
Pt c/o chronic back pain which radiates down both legs , surgery planned for feb 21, pt states he is out of his vicodin

## 2017-06-27 NOTE — Discharge Instructions (Signed)
Follow up with your primary care provider for long term pain management.   For pain take 1000 mg tylenol plus 10 mg toradol.  Use over the counter lidocaine patches for additional pain relief. Flexeril for associated muscle spasms. The emergency department has a strict policy regarding prescription of narcotic medications. We prescribe a short course for acute, new pain or injuries. We are unable to refill narcotic strength medications in the emergency department for chronic pain or repeatedly.  Refill need to be done by specialist or primary care provider or pain clinic.  Contact your primary care provider or specialist for chronic pain management and refill on narcotic medications.   Return for  back pain with abdominal pain, fevers, chills, bladder or bowel incontinence or retention, numbness or weakness or heaviness to your extremities

## 2017-10-19 ENCOUNTER — Encounter (HOSPITAL_BASED_OUTPATIENT_CLINIC_OR_DEPARTMENT_OTHER): Payer: Self-pay | Admitting: *Deleted

## 2017-10-19 ENCOUNTER — Emergency Department (HOSPITAL_BASED_OUTPATIENT_CLINIC_OR_DEPARTMENT_OTHER)
Admission: EM | Admit: 2017-10-19 | Discharge: 2017-10-19 | Disposition: A | Discharge status: Home / Self Care | Payer: Medicaid Other | Attending: Emergency Medicine | Admitting: Emergency Medicine

## 2017-10-19 ENCOUNTER — Other Ambulatory Visit: Payer: Self-pay

## 2017-10-19 DIAGNOSIS — F17200 Nicotine dependence, unspecified, uncomplicated: Secondary | ICD-10-CM | POA: Diagnosis not present

## 2017-10-19 DIAGNOSIS — E119 Type 2 diabetes mellitus without complications: Secondary | ICD-10-CM | POA: Insufficient documentation

## 2017-10-19 DIAGNOSIS — J111 Influenza due to unidentified influenza virus with other respiratory manifestations: Secondary | ICD-10-CM | POA: Diagnosis not present

## 2017-10-19 DIAGNOSIS — Z7984 Long term (current) use of oral hypoglycemic drugs: Secondary | ICD-10-CM | POA: Insufficient documentation

## 2017-10-19 DIAGNOSIS — R69 Illness, unspecified: Secondary | ICD-10-CM

## 2017-10-19 DIAGNOSIS — Z79899 Other long term (current) drug therapy: Secondary | ICD-10-CM | POA: Diagnosis not present

## 2017-10-19 DIAGNOSIS — R05 Cough: Secondary | ICD-10-CM | POA: Diagnosis present

## 2017-10-19 HISTORY — DX: Type 2 diabetes mellitus without complications: E11.9

## 2017-10-19 LAB — CBC WITH DIFFERENTIAL/PLATELET
BASOS ABS: 0 10*3/uL (ref 0.0–0.1)
Basophils Relative: 0 %
EOS PCT: 0 %
Eosinophils Absolute: 0 10*3/uL (ref 0.0–0.7)
HCT: 43.1 % (ref 39.0–52.0)
Hemoglobin: 15.2 g/dL (ref 13.0–17.0)
LYMPHS ABS: 1.1 10*3/uL (ref 0.7–4.0)
LYMPHS PCT: 10 %
MCH: 30.2 pg (ref 26.0–34.0)
MCHC: 35.3 g/dL (ref 30.0–36.0)
MCV: 85.7 fL (ref 78.0–100.0)
MONO ABS: 0.9 10*3/uL (ref 0.1–1.0)
MONOS PCT: 8 %
Neutro Abs: 9 10*3/uL — ABNORMAL HIGH (ref 1.7–7.7)
Neutrophils Relative %: 82 %
Platelets: 207 10*3/uL (ref 150–400)
RBC: 5.03 MIL/uL (ref 4.22–5.81)
RDW: 14.4 % (ref 11.5–15.5)
WBC: 11 10*3/uL — ABNORMAL HIGH (ref 4.0–10.5)

## 2017-10-19 LAB — COMPREHENSIVE METABOLIC PANEL
ALT: 27 U/L (ref 17–63)
AST: 35 U/L (ref 15–41)
Albumin: 3.7 g/dL (ref 3.5–5.0)
Alkaline Phosphatase: 93 U/L (ref 38–126)
Anion gap: 9 (ref 5–15)
BILIRUBIN TOTAL: 1 mg/dL (ref 0.3–1.2)
BUN: 13 mg/dL (ref 6–20)
CO2: 25 mmol/L (ref 22–32)
Calcium: 8.6 mg/dL — ABNORMAL LOW (ref 8.9–10.3)
Chloride: 103 mmol/L (ref 101–111)
Creatinine, Ser: 1.07 mg/dL (ref 0.61–1.24)
Glucose, Bld: 101 mg/dL — ABNORMAL HIGH (ref 65–99)
POTASSIUM: 3.3 mmol/L — AB (ref 3.5–5.1)
Sodium: 137 mmol/L (ref 135–145)
TOTAL PROTEIN: 7.9 g/dL (ref 6.5–8.1)

## 2017-10-19 LAB — URINALYSIS, ROUTINE W REFLEX MICROSCOPIC
BILIRUBIN URINE: NEGATIVE
Glucose, UA: NEGATIVE mg/dL
HGB URINE DIPSTICK: NEGATIVE
Ketones, ur: 15 mg/dL — AB
Leukocytes, UA: NEGATIVE
NITRITE: NEGATIVE
PROTEIN: NEGATIVE mg/dL
Specific Gravity, Urine: >1.03 — ABNORMAL HIGH (ref 1.005–1.030)
pH: 6 (ref 5.0–8.0)

## 2017-10-19 MED ORDER — ACETAMINOPHEN 325 MG PO TABS
650.0000 mg | ORAL_TABLET | Freq: Once | ORAL | Status: AC
  Administered 2017-10-19: 650 mg via ORAL
  Filled 2017-10-19: qty 2

## 2017-10-19 MED ORDER — SODIUM CHLORIDE 0.9 % IV BOLUS: 1000.0000 mL | Freq: Once | INTRAVENOUS | Status: DC

## 2017-10-19 NOTE — ED Notes (Signed)
Ambulated on r/a  HR 88-95, RR 18-22, SpO2 >95% for duration.  Only complaint was dizziness upon standing.

## 2017-10-19 NOTE — ED Provider Notes (Signed)
[facility] EMERGENCY DEPARTMENT Provider Note  CSN: [id number] Arrival date & time: 10/19/17  1047  History   Chief Complaint Chief Complaint  Patient presents with  . Cough   HPI Raymond Scott is a 57 y.o. male with a medical history of HLD, glaucoma, chronic back pain who presented to the ED for cough x 3days. Patient states "I just feel bad." Denies recent sick contacts and did not get a flu vaccine last flu season. Associated symptoms: generalized body aches, vomiting, decreased appetite, SOB and mild congestion. Denies fever, chest pain/tightness, leg swelling, abdominal pain, rhinorrhea and sore throat. Denies recent travel or exposures. Patient has not tried any medications or supportive measures prior to coming to the ED.  Additional history obtained by medical chart. Patient seen at urgent care yesterday 10/18/17 where a CXR was done. CXR had no acute cardiopulmonary processes or PNA. However, pt was given a Rx for doxycycline, but has not been to the pharmacy to get it filled yet.  Past Medical History:  Diagnosis Date  . Diabetes mellitus without complication (HCC)   . Glaucoma     There are no active problems to display for this patient.   Past Surgical History:  Procedure Laterality Date  . REFRACTIVE SURGERY       Home Medications    Prior to Admission medications   Medication Sig Start Date End Date Taking? Authorizing Provider  GLIPIZIDE ER PO Take by mouth.   Yes [provider]  METFORMIN HCL ER PO Take by mouth.   Yes [provider]  oxyCODONE-acetaminophen (PERCOCET/ROXICET) 5-325 MG tablet Take by mouth every 4 (four) hours as needed for severe pain.   Yes [provider]  cyclobenzaprine (FLEXERIL) 10 MG tablet Take 1 tablet (10 mg total) by mouth 2 (two) times daily as needed for muscle spasms. 06/27/17   Liberty Handy, PA-C  diphenhydramine-acetaminophen (TYLENOL PM) 25-500 MG TABS Take 1 tablet by mouth at bedtime as  needed. For sleep     [provider]  GABAPENTIN PO Take by mouth.    [provider]  HYDROcodone-acetaminophen (NORCO/VICODIN) 5-325 MG tablet Take 1 tablet by mouth every 4 (four) hours as needed. 12/14/15   Jacalyn Lefevre, MD  ibuprofen (ADVIL,MOTRIN) 800 MG tablet Take 1 tablet (800 mg total) by mouth 3 (three) times daily. 06/21/12   Elson Areas, PA-C  ketorolac (TORADOL) 10 MG tablet Take 1 tablet (10 mg total) by mouth every 6 (six) hours as needed. 06/27/17   Liberty Handy, PA-C  MELOXICAM PO Take by mouth.    [provider]    Family History History reviewed. No pertinent family history.  Social History Social History   Tobacco Use  . Smoking status: Current Every Day Smoker  . Smokeless tobacco: Never Used  Substance Use Topics  . Alcohol use: No  . Drug use: No     Allergies   Onion   Review of Systems Review of Systems  Constitutional: Positive for appetite change and fatigue. Negative for chills, diaphoresis and fever.  HENT: Positive for congestion. Negative for postnasal drip, rhinorrhea, sinus pressure, sinus pain, sore throat and trouble swallowing.   Eyes: Negative for visual disturbance.  Respiratory: Positive for cough and shortness of breath. Negative for chest tightness and wheezing.   Cardiovascular: Negative for chest pain, palpitations and leg swelling.  Gastrointestinal: Positive for abdominal pain and vomiting. Negative for constipation and diarrhea.  Endocrine: Negative.   Genitourinary: Negative.  Musculoskeletal: Positive for myalgias.  Skin: Negative.   Neurological: Negative.      Physical Exam Updated Vital Signs BP 107/69 (BP Location: Right Arm)   Pulse 74   Temp 99.3 F (37.4 C) (Oral)   Resp 18   Ht 5\' 7"  (1.702 m)   Wt 77.1 kg (170 lb)   SpO2 97%   BMI 26.63 kg/m   Physical Exam  Constitutional: He appears well-developed and well-nourished. He is cooperative.  Non-toxic appearance. He  does not have a sickly appearance. No distress.  HENT:  Head: Normocephalic and atraumatic.  Eyes: Conjunctivae and EOM are normal.  Neck: Normal range of motion. Neck supple.  Cardiovascular: Normal rate, regular rhythm, normal heart sounds, intact distal pulses and normal pulses.  No murmur heard. Pulmonary/Chest: Effort normal and breath sounds normal.  Abdominal: Soft. Bowel sounds are normal. There is no tenderness.  Lymphadenopathy:    He has no cervical adenopathy.  Neurological: He is alert.  Skin: Skin is warm and dry. Capillary refill takes less than 2 seconds. No rash noted. No cyanosis. Nails show no clubbing.  Psychiatric: He has a normal mood and affect. His behavior is normal.  Nursing note and vitals reviewed.    ED Treatments / Results  Labs (all labs ordered are listed, but only abnormal results are displayed) Labs Reviewed  CBC WITH DIFFERENTIAL/PLATELET - Abnormal; Notable for the following components:      Result Value   WBC 11.0 (*)    Neutro Abs 9.0 (*)    All other components within normal limits  COMPREHENSIVE METABOLIC PANEL - Abnormal; Notable for the following components:   Potassium 3.3 (*)    Glucose, Bld 101 (*)    Calcium 8.6 (*)    All other components within normal limits  URINALYSIS, ROUTINE W REFLEX MICROSCOPIC - Abnormal; Notable for the following components:   Specific Gravity, Urine >1.030 (*)    Ketones, ur 15 (*)    All other components within normal limits  RESPIRATORY PANEL BY PCR    EKG None  Radiology No results found.  Procedures Procedures (including critical care time)  Medications Ordered in ED Medications  acetaminophen (TYLENOL) tablet 650 mg (650 mg Oral Given 10/19/17 1230)     Initial Impression / Assessment and Plan / ED Course  Triage vital signs and the nursing notes have been reviewed.  Pertinent labs & imaging results that were available during care of the patient were reviewed and considered in medical  decision making (see chart for details).  Clinical Course as of Oct 20 1406  Wed Oct 19, 2017  1135 Febrile at 100.44F upon arrival. Tylenol given.    [GM]  1236 Labs are reassuring. Slightly elevated WBC at 11.0 which is expected given pt's fever and possibility of viral infection.   [GM]  1359 UA not suggestive of UTI. Likely dehydration given specific gravity. Has tolerated PO fluids in ED.   [GM]    Clinical Course User Index [GM] Mortis, Sharyon MedicusGabrielle I, [place]PA-C   Patient presents in no acute distress and well appearing. Febrile upon arrival at 100.44F, but broke with Tylenol. Physical exam is unremarkable and labs are reassuring. Oxygen saturations have been > 95% with and without ambulation. Pt's symptoms likely viral etiology possible influenza. Patient denies symptoms of ACS or PE.   Final Clinical Impressions(s) / ED Diagnoses  1. Viral Respiratory Illness. Likely influenza. CXR taken 10/18/17 did not show PNA or acute cardiopulm process. Education provided on OTC  and supportive measures for symptom relief. Take doxycycline as prescribed by urgent care.  Dispo: Home. After thorough clinical evaluation, this patient is determined to be medically stable and can be safely discharged with the previously mentioned treatment and/or outpatient follow-up/referral(s). At this time, there are no other apparent medical conditions that require further screening, evaluation or treatment.  Final diagnoses:  Influenza-like illness    ED Discharge Orders    None        Reva Bores 10/19/17 1408    Tilden Fossa, MD 10/19/17 [phone]

## 2017-10-19 NOTE — ED Triage Notes (Signed)
Pt reports cough, body aches and n/v x 3 days, seen by his pcp yesterday with cxr "they said it was normal and I just have a cold, but I still feel bad today so I came here to get looked at."

## 2017-10-19 NOTE — Discharge Instructions (Signed)
Continue to drink fluids and rest!  Get doxycycline prescription filled and take that as prescribed.

## 2017-10-19 NOTE — ED Notes (Signed)
Pt refuses iv attempt after removing his own iv. States "I don't want another needle." Gabriella PA alerted.

## 2017-10-20 LAB — RESPIRATORY PANEL BY PCR
Adenovirus: NOT DETECTED
BORDETELLA PERTUSSIS-RVPCR: NOT DETECTED
CORONAVIRUS NL63-RVPPCR: NOT DETECTED
Chlamydophila pneumoniae: NOT DETECTED
Coronavirus 229E: NOT DETECTED
Coronavirus HKU1: NOT DETECTED
Coronavirus OC43: NOT DETECTED
Influenza A: NOT DETECTED
Influenza B: NOT DETECTED
Metapneumovirus: NOT DETECTED
Mycoplasma pneumoniae: NOT DETECTED
PARAINFLUENZA VIRUS 1-RVPPCR: NOT DETECTED
PARAINFLUENZA VIRUS 2-RVPPCR: NOT DETECTED
PARAINFLUENZA VIRUS 4-RVPPCR: NOT DETECTED
Parainfluenza Virus 3: NOT DETECTED
RESPIRATORY SYNCYTIAL VIRUS-RVPPCR: NOT DETECTED
Rhinovirus / Enterovirus: NOT DETECTED

## 2017-12-08 ENCOUNTER — Encounter (HOSPITAL_BASED_OUTPATIENT_CLINIC_OR_DEPARTMENT_OTHER): Payer: Self-pay | Admitting: *Deleted

## 2017-12-08 ENCOUNTER — Other Ambulatory Visit: Payer: Self-pay

## 2017-12-08 DIAGNOSIS — Z5321 Procedure and treatment not carried out due to patient leaving prior to being seen by health care provider: Secondary | ICD-10-CM | POA: Insufficient documentation

## 2017-12-08 DIAGNOSIS — H5711 Ocular pain, right eye: Secondary | ICD-10-CM | POA: Insufficient documentation

## 2017-12-08 MED ORDER — FLUORESCEIN SODIUM 1 MG OP STRP: 1.0000 | ORAL_STRIP | Freq: Once | OPHTHALMIC | Status: DC

## 2017-12-08 MED ORDER — TETRACAINE HCL 0.5 % OP SOLN: 2.0000 [drp] | Freq: Once | OPHTHALMIC | Status: DC

## 2017-12-08 NOTE — ED Triage Notes (Signed)
He feels like he got something in his right eye earlier today. He used eye solution in his eye and it made his eye burn.

## 2017-12-09 ENCOUNTER — Emergency Department (HOSPITAL_BASED_OUTPATIENT_CLINIC_OR_DEPARTMENT_OTHER)
Admission: EM | Admit: 2017-12-09 | Discharge: 2017-12-09 | Discharge status: Against Medical Advice | Payer: Medicaid Other | Attending: Emergency Medicine | Admitting: Emergency Medicine

## 2017-12-09 NOTE — ED Notes (Signed)
Pt called for last time without response

## 2017-12-09 NOTE — ED Notes (Signed)
Called to take to treatment room  No response from lobby 

## 2017-12-09 NOTE — ED Triage Notes (Signed)
Called to take to treatment room  No response from lobby 

## 2017-12-09 NOTE — ED Notes (Signed)
Follow up call made  Pt is going to follow up with his eye doctor  Or pcp,  If he can't get appointment w his md will return to ed   12/09/17  0845  s Kedron Uno rn

## 2018-04-01 ENCOUNTER — Emergency Department (HOSPITAL_BASED_OUTPATIENT_CLINIC_OR_DEPARTMENT_OTHER): Payer: Medicaid Other

## 2018-04-01 ENCOUNTER — Emergency Department (HOSPITAL_BASED_OUTPATIENT_CLINIC_OR_DEPARTMENT_OTHER)
Admission: EM | Admit: 2018-04-01 | Discharge: 2018-04-01 | Disposition: A | Discharge status: Home / Self Care | Payer: Medicaid Other | Attending: Emergency Medicine | Admitting: Emergency Medicine

## 2018-04-01 ENCOUNTER — Encounter (HOSPITAL_BASED_OUTPATIENT_CLINIC_OR_DEPARTMENT_OTHER): Payer: Self-pay | Admitting: Emergency Medicine

## 2018-04-01 ENCOUNTER — Other Ambulatory Visit: Payer: Self-pay

## 2018-04-01 DIAGNOSIS — F17210 Nicotine dependence, cigarettes, uncomplicated: Secondary | ICD-10-CM | POA: Insufficient documentation

## 2018-04-01 DIAGNOSIS — R1013 Epigastric pain: Secondary | ICD-10-CM | POA: Diagnosis present

## 2018-04-01 DIAGNOSIS — E119 Type 2 diabetes mellitus without complications: Secondary | ICD-10-CM | POA: Insufficient documentation

## 2018-04-01 DIAGNOSIS — R1084 Generalized abdominal pain: Secondary | ICD-10-CM

## 2018-04-01 DIAGNOSIS — Z79899 Other long term (current) drug therapy: Secondary | ICD-10-CM | POA: Diagnosis not present

## 2018-04-01 LAB — URINALYSIS, ROUTINE W REFLEX MICROSCOPIC
Bilirubin Urine: NEGATIVE
Glucose, UA: NEGATIVE mg/dL
Hgb urine dipstick: NEGATIVE
KETONES UR: NEGATIVE mg/dL
LEUKOCYTES UA: NEGATIVE
NITRITE: NEGATIVE
PH: 6.5 (ref 5.0–8.0)
Protein, ur: NEGATIVE mg/dL
Specific Gravity, Urine: 1.015 (ref 1.005–1.030)

## 2018-04-01 LAB — COMPREHENSIVE METABOLIC PANEL
ALBUMIN: 3.6 g/dL (ref 3.5–5.0)
ALT: 23 U/L (ref 0–44)
AST: 20 U/L (ref 15–41)
Alkaline Phosphatase: 83 U/L (ref 38–126)
Anion gap: 8 (ref 5–15)
BUN: 11 mg/dL (ref 6–20)
CHLORIDE: 106 mmol/L (ref 98–111)
CO2: 24 mmol/L (ref 22–32)
Calcium: 9.1 mg/dL (ref 8.9–10.3)
Creatinine, Ser: 0.81 mg/dL (ref 0.61–1.24)
GFR calc Af Amer: >60 mL/min (ref >60)
GFR calc non Af Amer: >60 mL/min (ref >60)
Glucose, Bld: 97 mg/dL (ref 70–99)
POTASSIUM: 3.9 mmol/L (ref 3.5–5.1)
SODIUM: 138 mmol/L (ref 135–145)
Total Bilirubin: 0.4 mg/dL (ref 0.3–1.2)
Total Protein: 7 g/dL (ref 6.5–8.1)

## 2018-04-01 LAB — CBC WITH DIFFERENTIAL/PLATELET
ABS IMMATURE GRANULOCYTES: 0.01 10*3/uL (ref 0.00–0.07)
Basophils Absolute: 0 10*3/uL (ref 0.0–0.1)
Basophils Relative: 1 %
Eosinophils Absolute: 0.2 10*3/uL (ref 0.0–0.5)
Eosinophils Relative: 4 %
HEMATOCRIT: 44.9 % (ref 39.0–52.0)
Hemoglobin: 14.9 g/dL (ref 13.0–17.0)
IMMATURE GRANULOCYTES: 0 %
Lymphocytes Relative: 33 %
Lymphs Abs: 1.7 10*3/uL (ref 0.7–4.0)
MCH: 29.6 pg (ref 26.0–34.0)
MCHC: 33.2 g/dL (ref 30.0–36.0)
MCV: 89.3 fL (ref 80.0–100.0)
MONOS PCT: 8 %
Monocytes Absolute: 0.4 10*3/uL (ref 0.1–1.0)
NEUTROS ABS: 2.8 10*3/uL (ref 1.7–7.7)
NEUTROS PCT: 54 %
PLATELETS: 219 10*3/uL (ref 150–400)
RBC: 5.03 MIL/uL (ref 4.22–5.81)
RDW: 13.8 % (ref 11.5–15.5)
WBC: 5.2 10*3/uL (ref 4.0–10.5)
nRBC: 0 % (ref 0.0–0.2)

## 2018-04-01 LAB — LIPASE, BLOOD: Lipase: 34 U/L (ref 11–51)

## 2018-04-01 MED ORDER — ONDANSETRON 4 MG PO TBDP: 4.0000 mg | ORAL_TABLET | ORAL | 0 refills | Status: AC | PRN

## 2018-04-01 MED ORDER — ONDANSETRON HCL 4 MG/2ML IJ SOLN
4.0000 mg | Freq: Once | INTRAMUSCULAR | Status: AC
  Administered 2018-04-01: 4 mg via INTRAVENOUS
  Filled 2018-04-01: qty 2

## 2018-04-01 MED ORDER — SODIUM CHLORIDE 0.9 % IV SOLN
INTRAVENOUS | Status: DC | PRN
  Administered 2018-04-01: 250 mL via INTRAVENOUS

## 2018-04-01 MED ORDER — FAMOTIDINE IN NACL 20-0.9 MG/50ML-% IV SOLN
20.0000 mg | Freq: Once | INTRAVENOUS | Status: AC
  Administered 2018-04-01: 20 mg via INTRAVENOUS
  Filled 2018-04-01: qty 50

## 2018-04-01 MED ORDER — OMEPRAZOLE 20 MG PO CPDR: 20.0000 mg | DELAYED_RELEASE_CAPSULE | Freq: Every day | ORAL | 0 refills | Status: AC

## 2018-04-01 MED ORDER — SODIUM CHLORIDE 0.9 % IV BOLUS
1000.0000 mL | Freq: Once | INTRAVENOUS | Status: AC
  Administered 2018-04-01: 1000 mL via INTRAVENOUS

## 2018-04-01 MED ORDER — IOPAMIDOL (ISOVUE-300) INJECTION 61%
100.0000 mL | Freq: Once | INTRAVENOUS | Status: AC | PRN
Start: 2018-04-01 — End: 2018-04-01
  Administered 2018-04-01: 100 mL via INTRAVENOUS

## 2018-04-01 MED ORDER — POLYETHYLENE GLYCOL 3350 17 G PO PACK: 17.0000 g | PACK | Freq: Every day | ORAL | 0 refills | Status: AC

## 2018-04-01 NOTE — ED Provider Notes (Signed)
[facility] EMERGENCY DEPARTMENT Provider Note   CSN: [id number] Arrival date & time: 04/01/18  4098     History   Chief Complaint Chief Complaint  Patient presents with  . Abdominal Pain    HPI Raymond Scott is a 57 y.o. male.  HPI Stopped naproxen, had been taking for approximately 4 days. Stopped taking it a few days ago. Pain management thought might be due to constipation. Has had on and off for years. Now, this week having frequent watery stool, no vomiting.no fever. No dysuria. Pain was coming and going, now more constant. Aching epigastric pain, symetric across upper abdomen. Past Medical History:  Diagnosis Date  . Diabetes mellitus without complication (HCC)   . Glaucoma     There are no active problems to display for this patient.   Past Surgical History:  Procedure Laterality Date  . REFRACTIVE SURGERY          Home Medications    Prior to Admission medications   Medication Sig Start Date End Date Taking? Authorizing Provider  cyclobenzaprine (FLEXERIL) 10 MG tablet Take 1 tablet (10 mg total) by mouth 2 (two) times daily as needed for muscle spasms. 06/27/17   Liberty Handy, PA-C  diphenhydramine-acetaminophen (TYLENOL PM) 25-500 MG TABS Take 1 tablet by mouth at bedtime as needed. For sleep     [provider]  GABAPENTIN PO Take by mouth.    [provider]  GLIPIZIDE ER PO Take by mouth.    [provider]  HYDROcodone-acetaminophen (NORCO/VICODIN) 5-325 MG tablet Take 1 tablet by mouth every 4 (four) hours as needed. 12/14/15   Jacalyn Lefevre, MD  ibuprofen (ADVIL,MOTRIN) 800 MG tablet Take 1 tablet (800 mg total) by mouth 3 (three) times daily. 06/21/12   Elson Areas, PA-C  ketorolac (TORADOL) 10 MG tablet Take 1 tablet (10 mg total) by mouth every 6 (six) hours as needed. 06/27/17   Liberty Handy, PA-C  MELOXICAM PO Take by mouth.    [provider]  METFORMIN HCL ER PO Take by mouth.     [provider]  omeprazole (PRILOSEC) 20 MG capsule Take 1 capsule (20 mg total) by mouth daily. 04/01/18   Arby Barrette, MD  ondansetron (ZOFRAN ODT) 4 MG disintegrating tablet Take 1 tablet (4 mg total) by mouth every 4 (four) hours as needed for nausea or vomiting. 04/01/18   Arby Barrette, MD  oxyCODONE-acetaminophen (PERCOCET/ROXICET) 5-325 MG tablet Take by mouth every 4 (four) hours as needed for severe pain.    [provider]  polyethylene glycol (MIRALAX / GLYCOLAX) packet Take 17 g by mouth daily. 04/01/18   Arby Barrette, MD    Family History History reviewed. No pertinent family history.  Social History Social History   Tobacco Use  . Smoking status: Current Every Day Smoker    Packs/day: 0.50    Types: Cigarettes  . Smokeless tobacco: Never Used  Substance Use Topics  . Alcohol use: No  . Drug use: No     Allergies   Onion   Review of Systems Review of Systems 10 Systems reviewed and are negative for acute change except as noted in the HPI.   Physical Exam Updated Vital Signs BP (!) 147/87   Pulse (!) 54   Temp 97.7 F (36.5 C) (Oral)   Resp 14   Ht 5\' 6"  (1.676 m)   Wt 79.4 kg   SpO2 96%   BMI 28.25 kg/m   Physical Exam  Constitutional: He is oriented to person, place, and time.  Patient is alert and nontoxic.  No respiratory distress.  He does appear to be in pain.  Mental status clear.  HENT:  Mucous membranes pink and moist.  Oropharynx widely patent.  Eyes: Conjunctivae and EOM are normal.  Neck: Neck supple.  Cardiovascular: Normal rate, regular rhythm, normal heart sounds and intact distal pulses.  Pulmonary/Chest: Effort normal and breath sounds normal.  Abdominal:  Is soft.  Moderate reproducible pain in the epigastrium and bilateral upper quadrants.  No guarding present.  Lower abdomen is nontender.  No palpable mass.  Musculoskeletal: Normal range of motion. He exhibits no edema or tenderness.  Neurological:  He is alert and oriented to person, place, and time. He exhibits normal muscle tone. Coordination normal.  Skin: Skin is warm and dry.  Psychiatric: He has a normal mood and affect.     ED Treatments / Results  Labs (all labs ordered are listed, but only abnormal results are displayed) Labs Reviewed  URINALYSIS, ROUTINE W REFLEX MICROSCOPIC  COMPREHENSIVE METABOLIC PANEL  LIPASE, BLOOD  CBC WITH DIFFERENTIAL/PLATELET    EKG None  Radiology Ct Abdomen Pelvis W Contrast  Result Date: 04/01/2018 CLINICAL DATA:  Upper abdominal pain, diarrhea EXAM: CT ABDOMEN AND PELVIS WITH CONTRAST TECHNIQUE: Multidetector CT imaging of the abdomen and pelvis was performed using the standard protocol following bolus administration of intravenous contrast. CONTRAST:  ISOVUE-300 IOPAMIDOL (ISOVUE-300) INJECTION 61% COMPARISON:  10/16/2000 FINDINGS: Lower chest: Lung bases are clear. Hepatobiliary: Liver is within normal limits. Gallbladder is unremarkable. No intrahepatic or extrahepatic ductal dilatation. Pancreas: Within normal limits. Spleen: Within normal limits. Adrenals/Urinary Tract: Adrenal glands within normal limits. Punctate nonobstructing calculi in the left upper kidney (coronal image 78) and interpolar right kidney (coronal image 67). Kidneys are otherwise within normal limits. No hydronephrosis. Bladder is mildly thick-walled. Stomach/Bowel: Stomach is within normal limits. No evidence of bowel obstruction. Normal appendix (series 2/image 61). Vascular/Lymphatic: No evidence of abdominal aortic aneurysm. Atherosclerotic calcifications of the bilateral iliac vessels. No suspicious abdominopelvic lymphadenopathy. Reproductive: Prostate is grossly unremarkable. Other: No abdominopelvic ascites. Musculoskeletal: Mild degenerative changes at T12-L1 and L5-S1. IMPRESSION: No evidence of bowel obstruction.  Normal appendix. Punctate nonobstructing calculi bilaterally.  No hydronephrosis. Mildly  thick-walled bladder, nonspecific. Correlate for bladder outlet obstruction versus cystitis. Electronically Signed   By: Charline Bills M.D.   On: 04/01/2018 11:14    Procedures Procedures (including critical care time)  Medications Ordered in ED Medications  0.9 %  sodium chloride infusion (250 mLs Intravenous New Bag/Given 04/01/18 1001)  sodium chloride 0.9 % bolus 1,000 mL (1,000 mLs Intravenous New Bag/Given 04/01/18 0959)  ondansetron (ZOFRAN) injection 4 mg (4 mg Intravenous Given 04/01/18 1000)  famotidine (PEPCID) IVPB 20 mg premix (20 mg Intravenous New Bag/Given 04/01/18 1003)  iopamidol (ISOVUE-300) 61 % injection 100 mL (100 mLs Intravenous Contrast Given 04/01/18 1059)     Initial Impression / Assessment and Plan / ED Course  I have reviewed the triage vital signs and the nursing notes.  Pertinent labs & imaging results that were available during my care of the patient were reviewed by me and considered in my medical decision making (see chart for details).    Patient has predominantly epigastric abdominal pain.  It does spread out somewhat diffusely and symmetric over the upper quadrants.  It was initially more waxing and waning now the patient qualifies it is more persistent.  He does not have associated vomiting.  No hematemesis.  He reports some loose watery stool but typically has constipation.  Patient had recently been on a fairly short course of naproxen for chronic lower back pain.  He reports he is already stopped it for several days.  Consideration is for gastritis or possible peptic ulcer disease.  At this time, patient does not have any emesis no hematemesis, blood counts are stable.  No signs of inflammatory change or perforation around the stomach on CT scan.  No peritoneal signs on examination.  Blood pressure is normal.  At this time, etiology is unclear but I fairly high suspicion for gastritis or possibly peptic ulcer disease.  Patient is counseled on  avoidance of any ongoing naproxen at this time.  He will start omeprazole as prescribed.  He is also counseled on avoiding fatty foods in the event that this could possibly be biliary colic although CT scan and labs are not supportive of biliary etiology.  Patient reports he has a colonoscopy scheduled already for mid November.  He is counseled to call his gastroenterologist and let him know about his presentation today with severe epigastric pain.  He will start treatment and is advised to return if symptoms are not improving or any new or changing symptoms develop.  Final Clinical Impressions(s) / ED Diagnoses   Final diagnoses:  Generalized abdominal pain    ED Discharge Orders         Ordered    polyethylene glycol (MIRALAX / GLYCOLAX) packet  Daily     04/01/18 1224    omeprazole (PRILOSEC) 20 MG capsule  Daily     04/01/18 1224    ondansetron (ZOFRAN ODT) 4 MG disintegrating tablet  Every 4 hours PRN     04/01/18 1224           Arby BarrettePfeiffer, Zahari Xiang, MD 04/01/18 1240

## 2018-04-01 NOTE — Discharge Instructions (Signed)
1.  The specific cause for your abdominal pain has not been identified.  Your CT scan did not show any emergency findings.  At this time, consideration is given to possible gastritis and or constipation.  Descriptions of these conditions and things you can do to help them have been put in her discharge instructions.  To treat these 2 conditions, I want you to start Pepcid twice a day and take MiraLAX daily for the next 3 to 5 days until you are having a regular bowel movement. 2.  You must follow-up with your family doctor as soon as possible.  Return to the emergency department if you develop fevers, vomiting or other concerning or worsening symptoms.

## 2018-04-01 NOTE — ED Triage Notes (Signed)
Reports upper abdominal pain x 2 days.  States he has also had diarrhea since Monday.  Denies vomiting.  Reports intermittent sharp pain.  Denies chest pain, shortness of breath

## 2018-04-01 NOTE — ED Notes (Signed)
Pt requests pain medication, dr Donnald GarrePfeiffer notified

## 2019-09-22 ENCOUNTER — Ambulatory Visit: Payer: Medicaid Other | Attending: Internal Medicine

## 2020-06-11 IMAGING — CT CT ABD-PELV W/ CM
2 of 5 series · 16 of 46 positions shown, 18 images · IV contrast (iopamidol)
Comparison: 10/16/2000

CLINICAL DATA: Upper abdominal pain, diarrhea

EXAM:
CT ABDOMEN AND PELVIS WITH CONTRAST
TECHNIQUE: Multidetector CT imaging of the abdomen and pelvis was performed
using the standard protocol following bolus administration of
intravenous contrast.
CONTRAST:  100mL TN7YQR-QAA IOPAMIDOL (TN7YQR-QAA) INJECTION 61%

[Series 2: axial st · axial · 0.85mm/px · z∈[-535,-120]mm · 13 of 93 slices shown, 15 images]
[im 5/93  soft-tissue]
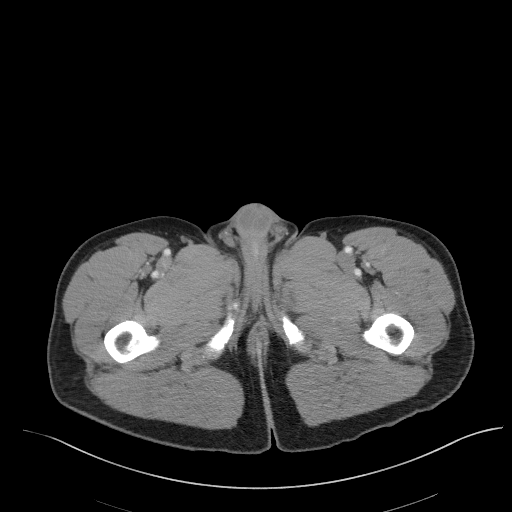
[im 5/93  bone]
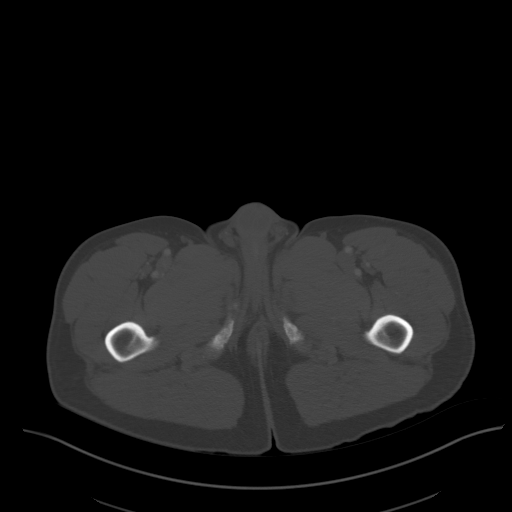
[im 15/93  soft-tissue]
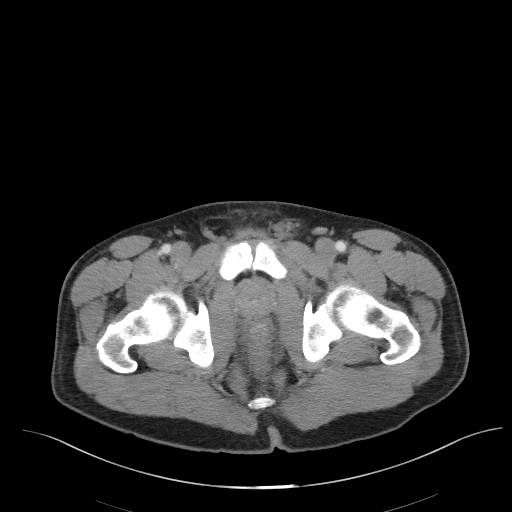
[im 20/93  soft-tissue]
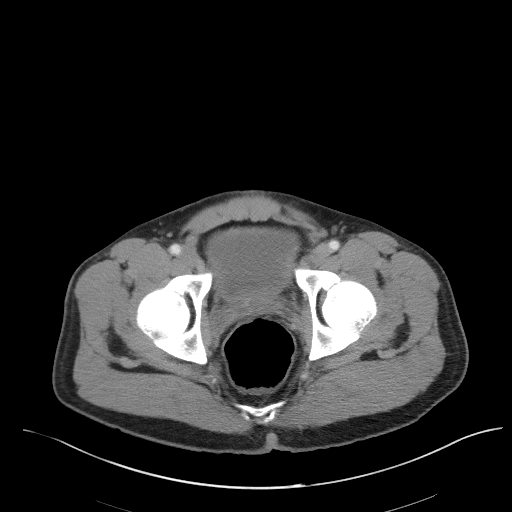
[im 25/93  soft-tissue]
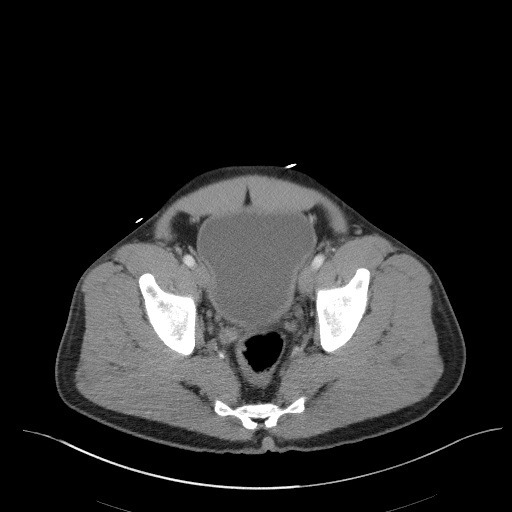
[im 34/93  soft-tissue]
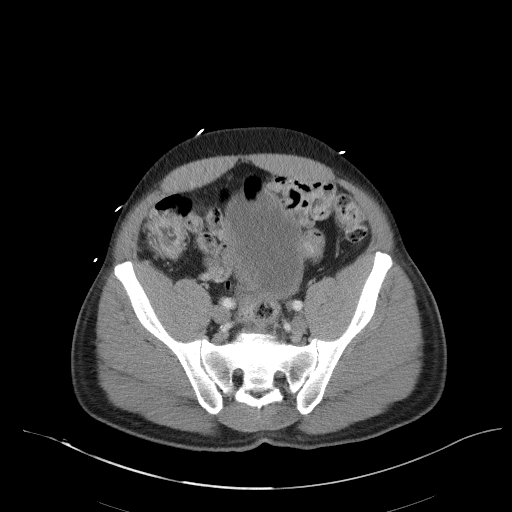
[im 39/93  soft-tissue]
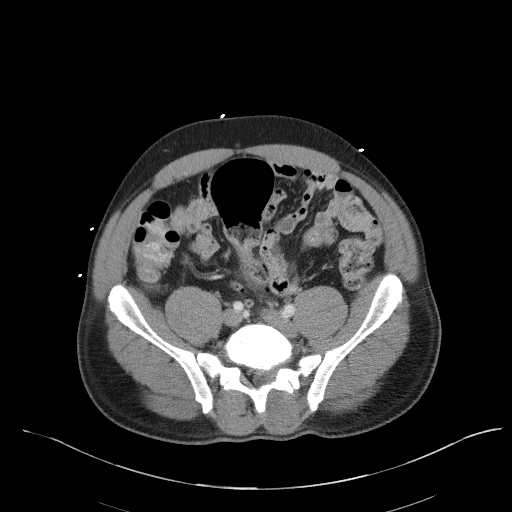
[im 49/93  soft-tissue]
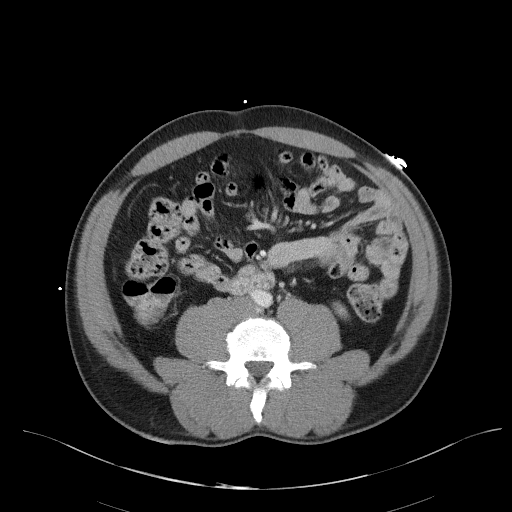
[im 54/93  soft-tissue]
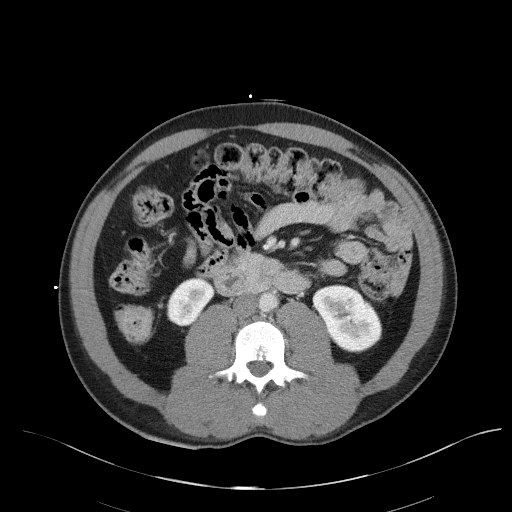
[im 59/93  soft-tissue]
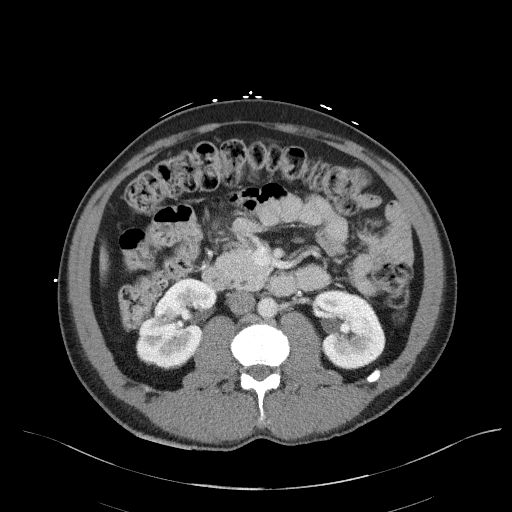
[im 59/93  bone]
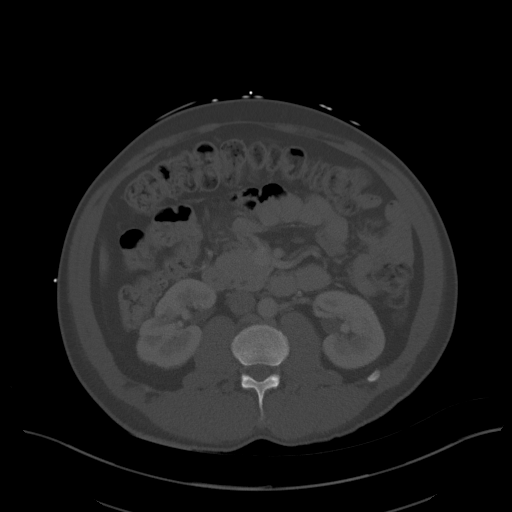
[im 68/93  soft-tissue]
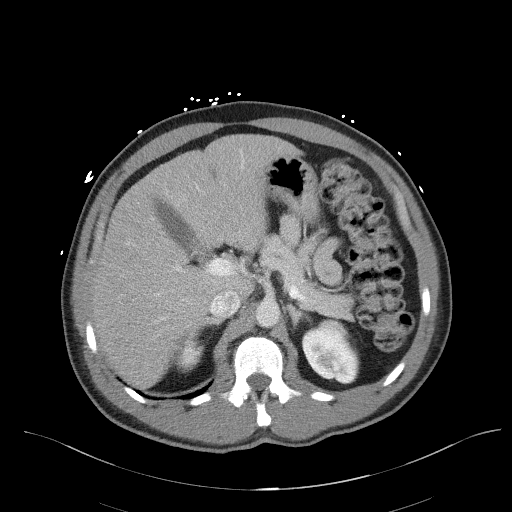
[im 73/93  soft-tissue]
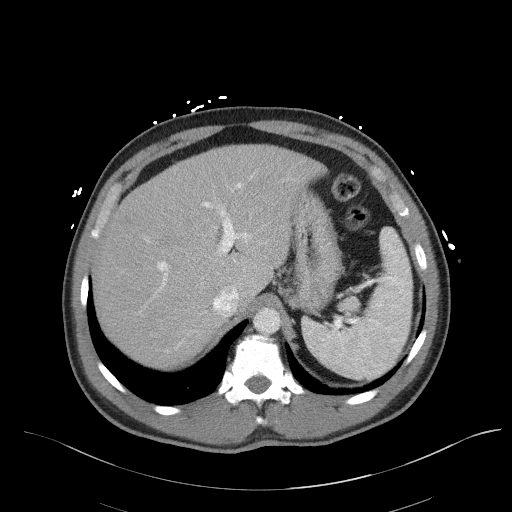
[im 78/93  soft-tissue]
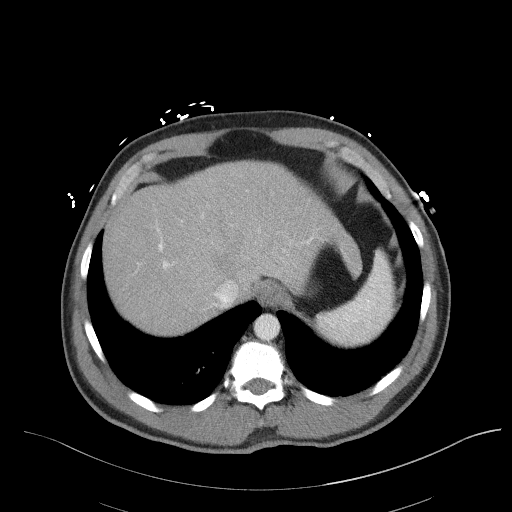
[im 88/93  soft-tissue]
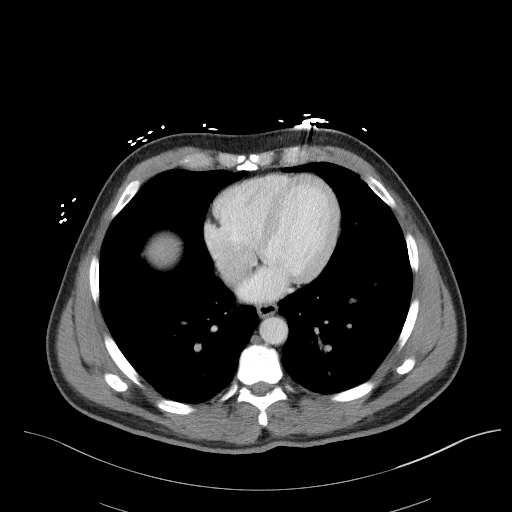

[Series 5: coronal st · coronal · 0.69mm/px · 3 of 102 slices shown]
[im 34/102  soft-tissue]
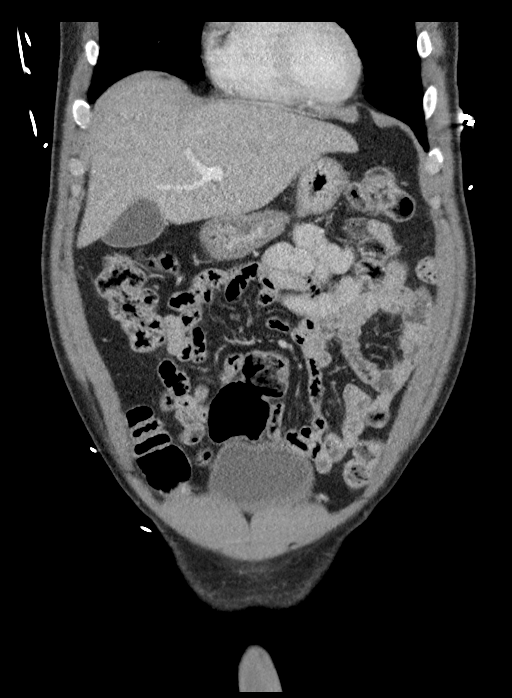
[im 45/102  soft-tissue]
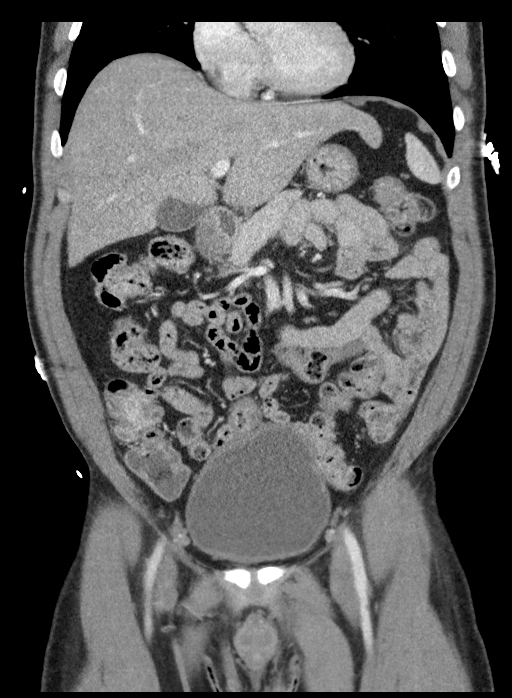
[im 57/102  soft-tissue]
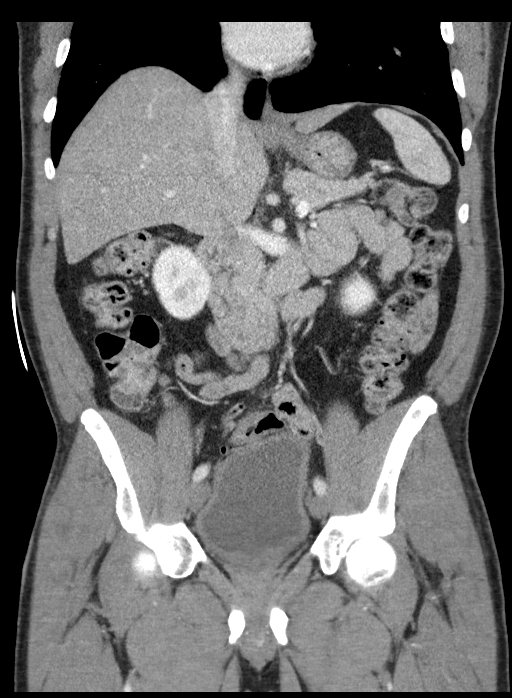

[16 of 46 positions shown; findings below may reference images not displayed]

FINDINGS: Lower chest: Lung bases are clear.

Hepatobiliary: Liver is within normal limits.

Gallbladder is unremarkable. No intrahepatic or extrahepatic ductal
dilatation.

Pancreas: Within normal limits.

Spleen: Within normal limits.

Adrenals/Urinary Tract: Adrenal glands within normal limits.

Punctate nonobstructing calculi in the left upper kidney (coronal
image 78) and interpolar right kidney (coronal image 67). Kidneys
are otherwise within normal limits. No hydronephrosis.

Bladder is mildly thick-walled.

Stomach/Bowel: Stomach is within normal limits.

No evidence of bowel obstruction.

Normal appendix (series 2/image 61).

Vascular/Lymphatic: No evidence of abdominal aortic aneurysm.

Atherosclerotic calcifications of the bilateral iliac vessels.

No suspicious abdominopelvic lymphadenopathy.

Reproductive: Prostate is grossly unremarkable.

Other: No abdominopelvic ascites.

Musculoskeletal: Mild degenerative changes at T12-L1 and L5-S1.
IMPRESSION: No evidence of bowel obstruction.  Normal appendix.

Punctate nonobstructing calculi bilaterally.  No hydronephrosis.

Mildly thick-walled bladder, nonspecific. Correlate for bladder
outlet obstruction versus cystitis.

## 2022-03-27 ENCOUNTER — Emergency Department (HOSPITAL_BASED_OUTPATIENT_CLINIC_OR_DEPARTMENT_OTHER)
Admission: EM | Admit: 2022-03-27 | Discharge: 2022-03-27 | Disposition: A | Discharge status: Home / Self Care | Payer: Medicaid Other | Attending: Emergency Medicine | Admitting: Emergency Medicine

## 2022-03-27 ENCOUNTER — Other Ambulatory Visit: Payer: Self-pay

## 2022-03-27 ENCOUNTER — Encounter (HOSPITAL_BASED_OUTPATIENT_CLINIC_OR_DEPARTMENT_OTHER): Payer: Self-pay | Admitting: Emergency Medicine

## 2022-03-27 DIAGNOSIS — N3001 Acute cystitis with hematuria: Secondary | ICD-10-CM | POA: Diagnosis not present

## 2022-03-27 DIAGNOSIS — E119 Type 2 diabetes mellitus without complications: Secondary | ICD-10-CM | POA: Insufficient documentation

## 2022-03-27 DIAGNOSIS — R319 Hematuria, unspecified: Secondary | ICD-10-CM | POA: Diagnosis present

## 2022-03-27 DIAGNOSIS — R310 Gross hematuria: Secondary | ICD-10-CM

## 2022-03-27 DIAGNOSIS — Z7984 Long term (current) use of oral hypoglycemic drugs: Secondary | ICD-10-CM | POA: Diagnosis not present

## 2022-03-27 LAB — URINALYSIS, ROUTINE W REFLEX MICROSCOPIC

## 2022-03-27 LAB — URINALYSIS, MICROSCOPIC (REFLEX): RBC / HPF: >50 RBC/hpf (ref 0–5)

## 2022-03-27 LAB — CBG MONITORING, ED: Glucose-Capillary: 137 mg/dL — ABNORMAL HIGH (ref 70–99)

## 2022-03-27 MED ORDER — CEPHALEXIN 500 MG PO CAPS: 500.0000 mg | ORAL_CAPSULE | Freq: Three times a day (TID) | ORAL | 0 refills | Status: AC

## 2022-03-27 MED ORDER — PHENAZOPYRIDINE HCL 200 MG PO TABS: 200.0000 mg | ORAL_TABLET | Freq: Three times a day (TID) | ORAL | 0 refills | Status: AC

## 2022-03-27 NOTE — ED Provider Notes (Signed)
[facility] EMERGENCY DEPARTMENT Provider Note   CSN: [id number] Arrival date & time: 03/27/22  1558     History {Add pertinent medical, surgical, social history, OB history to HPI:1} Chief Complaint  Patient presents with   Hematuria    Raymond Scott is a 61 y.o. male.  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 Authorizing Provider  cyclobenzaprine (FLEXERIL) 10 MG tablet Take 1 tablet (10 mg total) by mouth 2 (two) times daily as needed for muscle spasms. 06/27/17   Liberty Handy, PA-C  diphenhydramine-acetaminophen (TYLENOL PM) 25-500 MG TABS Take 1 tablet by mouth at bedtime as needed. For sleep     [provider]  GABAPENTIN PO Take by mouth.    [provider]  GLIPIZIDE ER PO Take by mouth.    [provider]  HYDROcodone-acetaminophen (NORCO/VICODIN) 5-325 MG tablet Take 1 tablet by mouth every 4 (four) hours as needed. 12/14/15   Jacalyn Lefevre, MD  ibuprofen (ADVIL,MOTRIN) 800 MG tablet Take 1 tablet (800 mg total) by mouth 3 (three) times daily. 06/21/12   Elson Areas, PA-C  ketorolac (TORADOL) 10 MG tablet Take 1 tablet (10 mg total) by mouth every 6 (six) hours as needed. 06/27/17   Liberty Handy, PA-C  MELOXICAM PO Take by mouth.    [provider]  METFORMIN HCL ER PO Take by mouth.    [provider]   omeprazole (PRILOSEC) 20 MG capsule Take 1 capsule (20 mg total) by mouth daily. 04/01/18   Arby Barrette, MD  ondansetron (ZOFRAN ODT) 4 MG disintegrating tablet Take 1 tablet (4 mg total) by mouth every 4 (four) hours as needed for nausea or vomiting. 04/01/18   Arby Barrette, MD  oxyCODONE-acetaminophen (PERCOCET/ROXICET) 5-325 MG tablet Take by mouth every 4 (four) hours as needed for severe pain.    [provider]  polyethylene glycol (MIRALAX / GLYCOLAX) packet Take 17 g by mouth daily. 04/01/18   Arby Barrette, MD      Allergies    Onion    Review of Systems   Review of Systems  Physical Exam Updated Vital Signs BP (!) 147/81 (BP Location: Right Arm)   Pulse 90   Temp 98.3 F (36.8 C) (Oral)   Resp 18   Ht 5\' 6"  (1.676 m)   Wt 76.2 kg   SpO2 95%   BMI 27.12 kg/m  Physical Exam  ED Results / Procedures / Treatments   Labs (all labs ordered are listed, but only abnormal results are displayed) Labs Reviewed  URINALYSIS, ROUTINE W REFLEX MICROSCOPIC - Abnormal; Notable for the following components:      Result Value   Color, Urine RED (*)    APPearance TURBID (*)    Glucose, UA   (*)    Value: TEST NOT  REPORTED DUE TO COLOR INTERFERENCE OF URINE PIGMENT   Hgb urine dipstick   (*)    Value: TEST NOT REPORTED DUE TO COLOR INTERFERENCE OF URINE PIGMENT   Bilirubin Urine   (*)    Value: TEST NOT REPORTED DUE TO COLOR INTERFERENCE OF URINE PIGMENT   Ketones, ur   (*)    Value: TEST NOT REPORTED DUE TO COLOR INTERFERENCE OF URINE PIGMENT   Protein, ur   (*)    Value: TEST NOT REPORTED DUE TO COLOR INTERFERENCE OF URINE PIGMENT   Nitrite   (*)    Value: TEST NOT REPORTED DUE TO COLOR INTERFERENCE OF URINE PIGMENT   Leukocytes,Ua   (*)    Value: TEST NOT REPORTED DUE TO COLOR INTERFERENCE OF URINE PIGMENT   All other components within normal limits  URINALYSIS, MICROSCOPIC (REFLEX) - Abnormal; Notable for the following components:   Bacteria, UA MANY (*)     All other components within normal limits  CBG MONITORING, ED - Abnormal; Notable for the following components:   Glucose-Capillary 137 (*)    All other components within normal limits  CBG MONITORING, ED    EKG None  Radiology No results found.  Procedures Procedures  {Document cardiac monitor, telemetry assessment procedure when appropriate:1}  Medications Ordered in ED Medications - No data to display  ED Course/ Medical Decision Making/ A&P                           Medical Decision Making Amount and/or Complexity of Data Reviewed Labs: ordered.   ***  {Document critical care time when appropriate:1} {Document review of labs and clinical decision tools ie heart score, Chads2Vasc2 etc:1}  {Document your independent review of radiology images, and any outside records:1} {Document your discussion with family members, caretakers, and with consultants:1} {Document social determinants of health affecting pt's care:1} {Document your decision making why or why not admission, treatments were needed:1} Final Clinical Impression(s) / ED Diagnoses Final diagnoses:  None    Rx / DC Orders ED Discharge Orders     None

## 2022-03-27 NOTE — ED Triage Notes (Signed)
Pt arrives pov, slow gait, reports groin injury when lifting heavy object x 3 days pta. Pt endorses hematuria and dysuria x 2 days

## 2022-03-30 LAB — URINE CULTURE: Culture: >=100000 — AB

## 2022-03-31 ENCOUNTER — Telehealth (HOSPITAL_BASED_OUTPATIENT_CLINIC_OR_DEPARTMENT_OTHER): Payer: Self-pay | Admitting: *Deleted

## 2022-03-31 NOTE — Telephone Encounter (Signed)
Post ED Visit - Positive Culture Follow-up  Culture report reviewed by antimicrobial stewardship pharmacist: [facility] []  , Pharm.D. [x]  Enzo Bi, Pharm.D., BCPS AQ-ID []  , Pharm.D., BCPS []  Celedonio Miyamoto, Pharm.D., BCPS []  Ashley, Garvin Fila.D., BCPS, AAHIVP []  , Pharm.D., BCPS, AAHIVP []  Georgina Pillion, PharmD, BCPS []  , PharmD, BCPS []  [place], PharmD, BCPS []  [address], PharmD []  , PharmD, BCPS []  Estella Husk, PharmD  Pharmacy Team []  Lysle Pearl, PharmD []  , PharmD []  Phillips Climes, PharmD []  , Rph []  Agapito Games) , PharmD []  Verlan Friends, PharmD []  , PharmD []  Mervyn Gay, PharmD []  , PharmD []  Vinnie Level, PharmD []  [facility], PharmD []  , PharmD []  Len Childs, PharmD   Positive urine culture Treated with Cephalexin, organism sensitive to the same and no further patient follow-up is required at this time.    [facility] 03/31/2022, 9:29 AM

## 2022-05-27 ENCOUNTER — Emergency Department (HOSPITAL_BASED_OUTPATIENT_CLINIC_OR_DEPARTMENT_OTHER)
Admission: EM | Admit: 2022-05-27 | Discharge: 2022-05-27 | Disposition: A | Discharge status: Home / Self Care | Payer: Medicaid Other | Attending: Emergency Medicine | Admitting: Emergency Medicine

## 2022-05-27 ENCOUNTER — Other Ambulatory Visit: Payer: Self-pay

## 2022-05-27 ENCOUNTER — Encounter (HOSPITAL_BASED_OUTPATIENT_CLINIC_OR_DEPARTMENT_OTHER): Payer: Self-pay | Admitting: Pediatrics

## 2022-05-27 ENCOUNTER — Emergency Department (HOSPITAL_BASED_OUTPATIENT_CLINIC_OR_DEPARTMENT_OTHER): Payer: Medicaid Other

## 2022-05-27 DIAGNOSIS — E119 Type 2 diabetes mellitus without complications: Secondary | ICD-10-CM | POA: Diagnosis not present

## 2022-05-27 DIAGNOSIS — R319 Hematuria, unspecified: Secondary | ICD-10-CM | POA: Diagnosis present

## 2022-05-27 DIAGNOSIS — R310 Gross hematuria: Secondary | ICD-10-CM | POA: Diagnosis not present

## 2022-05-27 DIAGNOSIS — Z7984 Long term (current) use of oral hypoglycemic drugs: Secondary | ICD-10-CM | POA: Diagnosis not present

## 2022-05-27 DIAGNOSIS — N3080 Other cystitis without hematuria: Secondary | ICD-10-CM | POA: Insufficient documentation

## 2022-05-27 LAB — CBC WITH DIFFERENTIAL/PLATELET
Abs Immature Granulocytes: 0.02 10*3/uL (ref 0.00–0.07)
Basophils Absolute: 0 10*3/uL (ref 0.0–0.1)
Basophils Relative: 1 %
Eosinophils Absolute: 0.2 10*3/uL (ref 0.0–0.5)
Eosinophils Relative: 3 %
HCT: 44.2 % (ref 39.0–52.0)
Hemoglobin: 15 g/dL (ref 13.0–17.0)
Immature Granulocytes: 0 %
Lymphocytes Relative: 34 %
Lymphs Abs: 1.9 10*3/uL (ref 0.7–4.0)
MCH: 30.4 pg (ref 26.0–34.0)
MCHC: 33.9 g/dL (ref 30.0–36.0)
MCV: 89.5 fL (ref 80.0–100.0)
Monocytes Absolute: 0.4 10*3/uL (ref 0.1–1.0)
Monocytes Relative: 7 %
Neutro Abs: 3.3 10*3/uL (ref 1.7–7.7)
Neutrophils Relative %: 55 %
Platelets: 222 10*3/uL (ref 150–400)
RBC: 4.94 MIL/uL (ref 4.22–5.81)
RDW: 14.5 % (ref 11.5–15.5)
WBC: 5.8 10*3/uL (ref 4.0–10.5)
nRBC: 0 % (ref 0.0–0.2)

## 2022-05-27 LAB — COMPREHENSIVE METABOLIC PANEL
ALT: 11 U/L (ref 0–44)
AST: 18 U/L (ref 15–41)
Albumin: 3.3 g/dL — ABNORMAL LOW (ref 3.5–5.0)
Alkaline Phosphatase: 101 U/L (ref 38–126)
Anion gap: 7 (ref 5–15)
BUN: 12 mg/dL (ref 8–23)
CO2: 26 mmol/L (ref 22–32)
Calcium: 8.6 mg/dL — ABNORMAL LOW (ref 8.9–10.3)
Chloride: 101 mmol/L (ref 98–111)
Creatinine, Ser: 0.91 mg/dL (ref 0.61–1.24)
GFR, Estimated: >60 mL/min (ref >60)
Glucose, Bld: 152 mg/dL — ABNORMAL HIGH (ref 70–99)
Potassium: 3.7 mmol/L (ref 3.5–5.1)
Sodium: 134 mmol/L — ABNORMAL LOW (ref 135–145)
Total Bilirubin: 0.4 mg/dL (ref 0.3–1.2)
Total Protein: 7.7 g/dL (ref 6.5–8.1)

## 2022-05-27 LAB — URINALYSIS, ROUTINE W REFLEX MICROSCOPIC
Bilirubin Urine: NEGATIVE
Glucose, UA: >=500 mg/dL — AB
Ketones, ur: NEGATIVE mg/dL
Nitrite: POSITIVE — AB
Protein, ur: 100 mg/dL — AB
Specific Gravity, Urine: 1.025 (ref 1.005–1.030)
pH: 6 (ref 5.0–8.0)

## 2022-05-27 LAB — URINALYSIS, MICROSCOPIC (REFLEX): RBC / HPF: >50 RBC/hpf (ref 0–5)

## 2022-05-27 MED ORDER — SODIUM CHLORIDE 0.9 % IV SOLN
1.0000 g | Freq: Once | INTRAVENOUS | Status: AC
  Administered 2022-05-27: 1 g via INTRAVENOUS
  Filled 2022-05-27: qty 10

## 2022-05-27 MED ORDER — ONDANSETRON HCL 4 MG PO TABS: 4.0000 mg | ORAL_TABLET | Freq: Three times a day (TID) | ORAL | 0 refills | Status: AC | PRN

## 2022-05-27 MED ORDER — OXYCODONE-ACETAMINOPHEN 5-325 MG PO TABS
1.0000 | ORAL_TABLET | ORAL | Status: DC | PRN
  Administered 2022-05-27: 1 via ORAL
  Filled 2022-05-27: qty 1

## 2022-05-27 MED ORDER — CIPROFLOXACIN HCL 500 MG PO TABS: 500.0000 mg | ORAL_TABLET | Freq: Two times a day (BID) | ORAL | 0 refills | Status: AC

## 2022-05-27 MED ORDER — KETOROLAC TROMETHAMINE 15 MG/ML IJ SOLN
15.0000 mg | Freq: Once | INTRAMUSCULAR | Status: AC
  Administered 2022-05-27: 15 mg via INTRAMUSCULAR
  Filled 2022-05-27: qty 1

## 2022-05-27 NOTE — ED Provider Notes (Signed)
[facility] Provider Note   CSN: [id number] Arrival date & time: 05/27/22  1327     History  Chief Complaint  Patient presents with   Hematuria   Groin Pain    Raymond Scott is a 62 y.o. male with past medical history of 2 diabetes who presents to the ED complaining of dysuria, frequency, and gross hematuria that started 1 day ago.  Patient denies abdominal pain, back pain, nausea, vomiting, fever, chills, testicular pain or swelling, lightheadedness, dizziness, chest pain, shortness of breath, urinary retention, or any other associated symptoms.  He reports that he has a burning like sensation in his genitalia that is worse with urination.  He has a history of recurrent urinary tract infections and was last treated for acute cystitis in November 2023 with Keflex.  He states that after that symptoms did resolve and he felt better.  He denies any antibiotic allergies.  He denies any history of kidney disease or kidney stones.  He does not take any anticoagulants.     Home Medications Prior to Admission medications   Medication Sig Start Date End Date Taking? Authorizing Provider  ciprofloxacin (CIPRO) 500 MG tablet Take 1 tablet (500 mg total) by mouth 2 (two) times daily for 14 days. 05/27/22 06/10/22 Yes Naryiah Schley L, PA-C  ondansetron (ZOFRAN) 4 MG tablet Take 1 tablet (4 mg total) by mouth every 8 (eight) hours as needed for up to 5 days for nausea or vomiting. 05/27/22 06/01/22 Yes Keen Ewalt L, PA-C  cyclobenzaprine (FLEXERIL) 10 MG tablet Take 1 tablet (10 mg total) by mouth 2 (two) times daily as needed for muscle spasms. 06/27/17   Kinnie Feil, PA-C  diphenhydramine-acetaminophen (TYLENOL PM) 25-500 MG TABS Take 1 tablet by mouth at bedtime as needed. For sleep     [provider]  GABAPENTIN PO Take by mouth.    [provider]  GLIPIZIDE ER PO Take by mouth.    [provider]  HYDROcodone-acetaminophen  (NORCO/VICODIN) 5-325 MG tablet Take 1 tablet by mouth every 4 (four) hours as needed. 12/14/15   Isla Pence, MD  ibuprofen (ADVIL,MOTRIN) 800 MG tablet Take 1 tablet (800 mg total) by mouth 3 (three) times daily. 06/21/12   Fransico Meadow, PA-C  ketorolac (TORADOL) 10 MG tablet Take 1 tablet (10 mg total) by mouth every 6 (six) hours as needed. 06/27/17   Kinnie Feil, PA-C  MELOXICAM PO Take by mouth.    [provider]  METFORMIN HCL ER PO Take by mouth.    [provider]  omeprazole (PRILOSEC) 20 MG capsule Take 1 capsule (20 mg total) by mouth daily. 04/01/18   Charlesetta Shanks, MD  ondansetron (ZOFRAN ODT) 4 MG disintegrating tablet Take 1 tablet (4 mg total) by mouth every 4 (four) hours as needed for nausea or vomiting. 04/01/18   Charlesetta Shanks, MD  oxyCODONE-acetaminophen (PERCOCET/ROXICET) 5-325 MG tablet Take by mouth every 4 (four) hours as needed for severe pain.    [provider]  phenazopyridine (PYRIDIUM) 200 MG tablet Take 1 tablet (200 mg total) by mouth 3 (three) times daily. 03/27/22   Gareth Morgan, MD  polyethylene glycol (MIRALAX / GLYCOLAX) packet Take 17 g by mouth daily. 04/01/18   Charlesetta Shanks, MD      Allergies    Onion    Review of Systems   Review of Systems  Constitutional:  Negative for activity change, appetite change, chills, fatigue and fever.  HENT:  Negative for congestion, ear pain and sore throat.   Eyes:  Negative for pain and visual disturbance.  Respiratory:  Negative for cough, chest tightness and shortness of breath.   Cardiovascular:  Negative for chest pain, palpitations and leg swelling.  Gastrointestinal:  Negative for abdominal pain, blood in stool, diarrhea, nausea and vomiting.  Genitourinary:  Positive for dysuria, frequency, hematuria and urgency. Negative for decreased urine volume and flank pain.  Musculoskeletal:  Negative for arthralgias, back pain, myalgias, neck pain and neck stiffness.   Skin:  Negative for color change and rash.  Neurological:  Negative for dizziness, seizures, syncope, weakness, light-headedness, numbness and headaches.  Psychiatric/Behavioral:  Negative for confusion.   All other systems reviewed and are negative.   Physical Exam Updated Vital Signs BP 121/82   Pulse 67   Temp 97.8 F (36.6 C) (Oral)   Resp 18   Ht 5\' 6"  (1.676 m)   Wt 79.8 kg   SpO2 96%   BMI 28.41 kg/m  Physical Exam Vitals and nursing note reviewed.  Constitutional:      General: He is not in acute distress.    Appearance: Normal appearance. He is not ill-appearing or toxic-appearing.  HENT:     Head: Normocephalic and atraumatic.     Mouth/Throat:     Mouth: Mucous membranes are moist.  Eyes:     Conjunctiva/sclera: Conjunctivae normal.  Cardiovascular:     Rate and Rhythm: Normal rate and regular rhythm.     Heart sounds: No murmur heard. Pulmonary:     Effort: Pulmonary effort is normal. No respiratory distress.     Breath sounds: Normal breath sounds. No wheezing, rhonchi or rales.  Abdominal:     General: Abdomen is flat. There is no distension.     Palpations: Abdomen is soft.     Tenderness: There is abdominal tenderness (mild suprapubic, remainder of abdomen non-tender). There is no right CVA tenderness, left CVA tenderness, guarding or rebound.  Musculoskeletal:        General: Normal range of motion.     Cervical back: Neck supple.     Right lower leg: No edema.     Left lower leg: No edema.     Comments: Mild diffuse lower back tenderness, patient states this is not new and unchanged from his baseline  Skin:    General: Skin is warm and dry.     Capillary Refill: Capillary refill takes less than 2 seconds.  Neurological:     General: No focal deficit present.     Mental Status: He is alert. Mental status is at baseline.     Motor: No weakness.  Psychiatric:        Mood and Affect: Mood normal.        Behavior: Behavior normal.     ED  Results / Procedures / Treatments   Labs (all labs ordered are listed, but only abnormal results are displayed) Labs Reviewed  URINALYSIS, ROUTINE W REFLEX MICROSCOPIC - Abnormal; Notable for the following components:      Result Value   Color, Urine AMBER (*)    APPearance HAZY (*)    Glucose, UA >=500 (*)    Hgb urine dipstick LARGE (*)    Protein, ur 100 (*)    Nitrite POSITIVE (*)    Leukocytes,Ua TRACE (*)    All other components within normal limits  URINALYSIS, MICROSCOPIC (REFLEX) - Abnormal; Notable for the following components:   Bacteria, UA MANY (*)  All other components within normal limits  COMPREHENSIVE METABOLIC PANEL - Abnormal; Notable for the following components:   Sodium 134 (*)    Glucose, Bld 152 (*)    Calcium 8.6 (*)    Albumin 3.3 (*)    All other components within normal limits  CBC WITH DIFFERENTIAL/PLATELET    EKG None  Radiology CT Renal Stone Study  Result Date: 05/27/2022 CLINICAL DATA:  Abdominal pain, flank pain, hematuria EXAM: CT ABDOMEN AND PELVIS WITHOUT CONTRAST TECHNIQUE: Multidetector CT imaging of the abdomen and pelvis was performed following the standard protocol without IV contrast. RADIATION DOSE REDUCTION: This exam was performed according to the departmental dose-optimization program which includes automated exposure control, adjustment of the mA and/or kV according to patient size and/or use of iterative reconstruction technique. COMPARISON:  11/15/2018 . FINDINGS: Lower chest: Small linear density in the lingula may suggest scarring or subsegmental atelectasis. Hepatobiliary: No focal abnormalities are seen in liver. There is no dilation of bile ducts. Gallbladder is unremarkable. Pancreas: No focal abnormalities are seen. Spleen: Unremarkable. Adrenals/Urinary Tract: Adrenals are unremarkable. There is no hydronephrosis. There are few small bilateral renal stones measuring up to 3 mm. There are no calcific densities in the courses  of the ureters. There is diffuse wall thickening in the urinary bladder. There are numerous small pockets of air in the bladder wall. There is air-fluid level in the lumen of the bladder. Stomach/Bowel: Stomach is unremarkable. Small bowel loops are not dilated. Appendix is not dilated. Moderate to large amount of stool is seen in colon. There is no wall thickening in colon. Scattered diverticula are seen without signs of focal acute diverticulitis. Vascular/Lymphatic: Scattered arterial calcifications are seen. Reproductive: Unremarkable. Other: There is no ascites or pneumoperitoneum. Umbilical hernia containing fat is seen. Right inguinal hernia containing fat is seen. Musculoskeletal: Degenerative changes are noted at the L5-S1 level with disc space narrowing and bony spurs with encroachment of neural foramina. IMPRESSION: There is abnormal diffuse wall thickening in the urinary bladder. There are numerous small pockets of air in the bladder wall. Findings suggest acute emphysematous cystitis. There is no evidence of intestinal obstruction or pneumoperitoneum. There is no hydronephrosis. There are few small bilateral renal stones. Appendix is not dilated.  Scattered diverticula are seen in colon. Other findings as described in the body of the report. Electronically Signed   By: Ernie Avena M.D.   On: 05/27/2022 17:09    Procedures None  Medications Ordered in ED Medications  cefTRIAXone (ROCEPHIN) 1 g in sodium chloride 0.9 % 100 mL IVPB (1 g Intravenous New Bag/Given 05/27/22 1757)  ketorolac (TORADOL) 15 MG/ML injection 15 mg (15 mg Intramuscular Given 05/27/22 1714)    ED Course/ Medical Decision Making/ A&P                           Medical Decision Making Amount and/or Complexity of Data Reviewed Labs: ordered. Decision-making details documented in ED Course. Radiology: ordered. Decision-making details documented in ED Course.  Risk Prescription drug management.   This is a  62 year old male presenting to the ED with 1 day course of acute dysuria.  Patient otherwise has had no associated symptoms and on exam has a soft abdomen with only mild suprapubic tenderness and no peritoneal signs.  Otherwise, patient's exam is unremarkable and he is afebrile with normal heart rate.  Patient's urinalysis consistent with acute cystitis with hematuria.  He was last treated for acute cystitis  in November 2023 with Keflex and record review shows that culture grew out E. coli which was pansensitive.  Patient has not had any recurrent urinary tract infections since that time and reports that he completed the antibiotic course with resolution of his symptoms.  With patient's history and reported significant dysuria proceeded with CT renal study to rule out infected stone, obstruction, or other causes of patient's symptoms.  Blood work revealed no leukocytosis, normal electrolytes, and a creatinine of 0.91.  CT renal study with finding of acute emphysematous cystitis with diffuse bladder wall thickening and small pockets of air within the bladder.  No other acute findings identified.  Patient reported to me that he is the primary caregiver for his wife and is unable to remain in the hospital much longer.  He has been previously referred to urology by his PCP but has not yet had this appointment.  Considering this finding, discussed patient case with attending physician who agreed that outpatient management is reasonable considering patient's current presentation as well as social situation.  We also reviewed up-to-date recommendations in the setting of acute emphysematous cystitis will give 1 dose of empiric antibiotics in the ED prior to discharge (1 g Rocephin) and then discharge patient home on 14-day course of Cipro.  Patient and family member repeatedly educated on the importance of completing these antibiotics as well as following up outpatient with urology.  Given information for our urologist  on-call should he not be able to follow-up with urologist as recommended by his PCP.  Patient states that he will take all of these antibiotics, follow-up as recommended, and return to the ED for any worsening symptoms, fever, vomiting, severe abdominal pain, confusion, or other concerns. On re-exam, pt's pain is well controlled, abdomen is non-tender, and he is non-toxic appearing. Patient and family member are reliable and express good understanding of need for antibiotic completion and signs to return to emergency department. All questions answered and stable at time of discharge.          Final Clinical Impression(s) / ED Diagnoses Final diagnoses:  acute emphysematous cystitis  Gross hematuria    Rx / DC Orders ED Discharge Orders          Ordered    ciprofloxacin (CIPRO) 500 MG tablet  2 times daily        05/27/22 1752    ondansetron (ZOFRAN) 4 MG tablet  Every 8 hours PRN        05/27/22 1752              Suzzette Righter, PA-C 05/27/22 1806    Gareth Morgan, MD 05/28/22 1421

## 2022-05-27 NOTE — ED Notes (Signed)
Patient transported to CT 

## 2022-05-27 NOTE — ED Triage Notes (Signed)
C/O groin pain, mid section along with bloody urine last night.

## 2022-05-27 NOTE — Discharge Instructions (Addendum)
Thank you for letting Raymond Scott take care of you today.  As discussed, you have a pretty significant urinary tract infection we believe is causing your symptoms.  Sometimes these infections cause you to be hospitalized. As you are otherwise feeling well, your vital signs are normal, and there were no other significant findings on your CT scan, we believe it is reasonable to give you a dose of an antibiotic in the emergency department today (called Rocephin) and discharge you home on an antibiotic by mouth (called Cipro). Please take these as instructed and completed all of the antibiotics prescribed. I have also give you a nausea medication should you get this side effect from the antibiotic.  It is very important you follow up with a urologist. Dennis Bast may follow up either with the urologist provided above (Dr. Milford Cage) or the urologist your primary care provider recommended. Follow-up with them (or your PCP if unable to get a urology appointment) within the next week for re-evaluation.  Should you develop worsening symptoms or pain, fever, uncontrollable vomiting, confusion, or other new symptoms or concerns, please return to nearest emergency department for re-evaluation. You may require admission to the hospital at that time to receive antibiotics by IV.

## 2023-08-03 ENCOUNTER — Encounter (HOSPITAL_BASED_OUTPATIENT_CLINIC_OR_DEPARTMENT_OTHER): Payer: Self-pay

## 2023-08-03 ENCOUNTER — Other Ambulatory Visit: Payer: Self-pay

## 2023-08-03 ENCOUNTER — Emergency Department (HOSPITAL_BASED_OUTPATIENT_CLINIC_OR_DEPARTMENT_OTHER)

## 2023-08-03 ENCOUNTER — Emergency Department (HOSPITAL_BASED_OUTPATIENT_CLINIC_OR_DEPARTMENT_OTHER)
Admission: EM | Admit: 2023-08-03 | Discharge: 2023-08-04 | Discharge status: Against Medical Advice | Attending: Emergency Medicine | Admitting: Emergency Medicine

## 2023-08-03 DIAGNOSIS — Z5321 Procedure and treatment not carried out due to patient leaving prior to being seen by health care provider: Secondary | ICD-10-CM | POA: Diagnosis not present

## 2023-08-03 DIAGNOSIS — M5431 Sciatica, right side: Secondary | ICD-10-CM | POA: Diagnosis not present

## 2023-08-03 DIAGNOSIS — R200 Anesthesia of skin: Secondary | ICD-10-CM | POA: Insufficient documentation

## 2023-08-03 DIAGNOSIS — R519 Headache, unspecified: Secondary | ICD-10-CM | POA: Insufficient documentation

## 2023-08-03 LAB — CBC
HCT: 46.4 % (ref 39.0–52.0)
Hemoglobin: 16.1 g/dL (ref 13.0–17.0)
MCH: 30.9 pg (ref 26.0–34.0)
MCHC: 34.7 g/dL (ref 30.0–36.0)
MCV: 89.1 fL (ref 80.0–100.0)
Platelets: 256 10*3/uL (ref 150–400)
RBC: 5.21 MIL/uL (ref 4.22–5.81)
RDW: 14.5 % (ref 11.5–15.5)
WBC: 4.4 10*3/uL (ref 4.0–10.5)
nRBC: 0 % (ref 0.0–0.2)

## 2023-08-03 LAB — DIFFERENTIAL
Abs Immature Granulocytes: 0.01 10*3/uL (ref 0.00–0.07)
Basophils Absolute: 0.1 10*3/uL (ref 0.0–0.1)
Basophils Relative: 1 %
Eosinophils Absolute: 0.1 10*3/uL (ref 0.0–0.5)
Eosinophils Relative: 2 %
Immature Granulocytes: 0 %
Lymphocytes Relative: 38 %
Lymphs Abs: 1.7 10*3/uL (ref 0.7–4.0)
Monocytes Absolute: 0.3 10*3/uL (ref 0.1–1.0)
Monocytes Relative: 7 %
Neutro Abs: 2.3 10*3/uL (ref 1.7–7.7)
Neutrophils Relative %: 52 %

## 2023-08-03 LAB — APTT: aPTT: 28 s (ref 24–36)

## 2023-08-03 LAB — COMPREHENSIVE METABOLIC PANEL
ALT: 30 U/L (ref 0–44)
AST: 23 U/L (ref 15–41)
Albumin: 3.7 g/dL (ref 3.5–5.0)
Alkaline Phosphatase: 93 U/L (ref 38–126)
Anion gap: 9 (ref 5–15)
BUN: 16 mg/dL (ref 8–23)
CO2: 22 mmol/L (ref 22–32)
Calcium: 8.8 mg/dL — ABNORMAL LOW (ref 8.9–10.3)
Chloride: 106 mmol/L (ref 98–111)
Creatinine, Ser: 0.9 mg/dL (ref 0.61–1.24)
GFR, Estimated: >60 mL/min (ref >60)
Glucose, Bld: 120 mg/dL — ABNORMAL HIGH (ref 70–99)
Potassium: 3.5 mmol/L (ref 3.5–5.1)
Sodium: 137 mmol/L (ref 135–145)
Total Bilirubin: 0.7 mg/dL (ref 0.0–1.2)
Total Protein: 8 g/dL (ref 6.5–8.1)

## 2023-08-03 LAB — PROTIME-INR
INR: 1 (ref 0.8–1.2)
Prothrombin Time: 12.9 s (ref 11.4–15.2)

## 2023-08-03 LAB — CBG MONITORING, ED: Glucose-Capillary: 114 mg/dL — ABNORMAL HIGH (ref 70–99)

## 2023-08-03 LAB — ETHANOL: Alcohol, Ethyl (B): <10 mg/dL (ref <10)

## 2023-08-03 MED ORDER — IOHEXOL 350 MG/ML SOLN
75.0000 mL | Freq: Once | INTRAVENOUS | Status: AC | PRN
  Administered 2023-08-03: 75 mL via INTRAVENOUS

## 2023-08-03 MED ORDER — SODIUM CHLORIDE 0.9% FLUSH
3.0000 mL | Freq: Once | INTRAVENOUS | Status: DC
  Filled 2023-08-03: qty 3

## 2023-08-03 NOTE — ED Notes (Signed)
 Pt LWBS and IV removed

## 2023-08-03 NOTE — ED Notes (Signed)
 Patient transported to CT

## 2023-08-03 NOTE — ED Triage Notes (Addendum)
 Pt states this am he felt tightness on left side of face and numbness in left arm.   Equal grips and strength, no facial dropping noted, smile is equal Does report some confusion today which has resolved. Does report his vision has been blurry today Pt A & O x4 Rt. Leg sciatica pain which he has hx of

## 2023-10-10 ENCOUNTER — Other Ambulatory Visit: Payer: Self-pay

## 2023-10-10 ENCOUNTER — Encounter (HOSPITAL_BASED_OUTPATIENT_CLINIC_OR_DEPARTMENT_OTHER): Payer: Self-pay | Admitting: Emergency Medicine

## 2023-10-10 ENCOUNTER — Emergency Department (HOSPITAL_BASED_OUTPATIENT_CLINIC_OR_DEPARTMENT_OTHER)

## 2023-10-10 ENCOUNTER — Emergency Department (HOSPITAL_BASED_OUTPATIENT_CLINIC_OR_DEPARTMENT_OTHER)
Admission: EM | Admit: 2023-10-10 | Discharge: 2023-10-10 | Disposition: A | Discharge status: Home / Self Care | Attending: Emergency Medicine | Admitting: Emergency Medicine

## 2023-10-10 DIAGNOSIS — M545 Low back pain, unspecified: Secondary | ICD-10-CM | POA: Diagnosis present

## 2023-10-10 DIAGNOSIS — M25511 Pain in right shoulder: Secondary | ICD-10-CM | POA: Insufficient documentation

## 2023-10-10 DIAGNOSIS — Y92410 Unspecified street and highway as the place of occurrence of the external cause: Secondary | ICD-10-CM | POA: Diagnosis not present

## 2023-10-10 DIAGNOSIS — M7918 Myalgia, other site: Secondary | ICD-10-CM

## 2023-10-10 MED ORDER — IBUPROFEN 400 MG PO TABS
600.0000 mg | ORAL_TABLET | Freq: Once | ORAL | Status: AC
  Administered 2023-10-10: 600 mg via ORAL
  Filled 2023-10-10: qty 1
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
## 2023-10-10 NOTE — Discharge Instructions (Signed)
 Continue your regular medications for chronic pain. You can add ibuprofen  every 6 hours for added relief. As we discussed, expect increased soreness over the next 2-3 days. Rest. See your doctor if you are no better in 4-5 days.
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
## 2023-10-14 ENCOUNTER — Ambulatory Visit
Admission: RE | Admit: 2023-10-14 | Discharge: 2023-10-14 | Disposition: A | Discharge status: Home / Self Care | Payer: Self-pay | Source: Ambulatory Visit

## 2023-10-14 ENCOUNTER — Other Ambulatory Visit: Payer: Self-pay

## 2023-10-14 DIAGNOSIS — M545 Low back pain, unspecified: Secondary | ICD-10-CM

## 2023-11-25 ENCOUNTER — Emergency Department (HOSPITAL_BASED_OUTPATIENT_CLINIC_OR_DEPARTMENT_OTHER)

## 2023-11-25 ENCOUNTER — Emergency Department (HOSPITAL_BASED_OUTPATIENT_CLINIC_OR_DEPARTMENT_OTHER)
Admission: EM | Admit: 2023-11-25 | Discharge: 2023-11-26 | Disposition: A | Discharge status: Home / Self Care | Attending: Emergency Medicine | Admitting: Emergency Medicine

## 2023-11-25 ENCOUNTER — Other Ambulatory Visit: Payer: Self-pay

## 2023-11-25 ENCOUNTER — Encounter (HOSPITAL_BASED_OUTPATIENT_CLINIC_OR_DEPARTMENT_OTHER): Payer: Self-pay

## 2023-11-25 DIAGNOSIS — S6991XA Unspecified injury of right wrist, hand and finger(s), initial encounter: Secondary | ICD-10-CM | POA: Diagnosis present

## 2023-11-25 DIAGNOSIS — S0081XA Abrasion of other part of head, initial encounter: Secondary | ICD-10-CM | POA: Insufficient documentation

## 2023-11-25 DIAGNOSIS — Y92410 Unspecified street and highway as the place of occurrence of the external cause: Secondary | ICD-10-CM | POA: Diagnosis not present

## 2023-11-25 DIAGNOSIS — S60221A Contusion of right hand, initial encounter: Secondary | ICD-10-CM | POA: Diagnosis not present

## 2023-11-25 DIAGNOSIS — M79631 Pain in right forearm: Secondary | ICD-10-CM | POA: Diagnosis not present

## 2023-11-25 DIAGNOSIS — E119 Type 2 diabetes mellitus without complications: Secondary | ICD-10-CM | POA: Diagnosis not present

## 2023-11-25 DIAGNOSIS — S80211A Abrasion, right knee, initial encounter: Secondary | ICD-10-CM | POA: Diagnosis not present

## 2023-11-25 MED ORDER — KETOROLAC TROMETHAMINE 30 MG/ML IJ SOLN
30.0000 mg | Freq: Once | INTRAMUSCULAR | Status: AC
  Administered 2023-11-26: 30 mg via INTRAMUSCULAR
  Filled 2023-11-25: qty 1

## 2023-11-25 MED ORDER — METHOCARBAMOL 500 MG PO TABS: 500.0000 mg | ORAL_TABLET | Freq: Three times a day (TID) | ORAL | 0 refills | Status: AC | PRN

## 2023-11-25 MED ORDER — METHOCARBAMOL 500 MG PO TABS
500.0000 mg | ORAL_TABLET | Freq: Once | ORAL | Status: AC
  Administered 2023-11-26: 500 mg via ORAL
  Filled 2023-11-25: qty 1
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
